# Patient Record
Sex: Female | Born: 1975
Health system: Southern US, Community
[De-identification: ages and names within clinical notes are randomized; demographics above are authoritative.]

## PROBLEM LIST (undated history)

## (undated) DIAGNOSIS — E7800 Pure hypercholesterolemia, unspecified: Secondary | ICD-10-CM

## (undated) DIAGNOSIS — E119 Type 2 diabetes mellitus without complications: Secondary | ICD-10-CM

## (undated) HISTORY — DX: Pure hypercholesterolemia, unspecified: E78.00

---

## 2015-07-19 HISTORY — PX: DILATION AND CURETTAGE OF UTERUS: SHX78

## 2020-01-23 NOTE — Progress Notes (Deleted)
    New patient visit   Patient: Cathy Ponce   DOB: [birth year]   44 y.o. Female  MRN: [id number] Visit Date: 01/24/2020  Today's healthcare provider: Trey Sailors, PA-C   No chief complaint on file.  Subjective    Cathy Ponce is a 44 y.o. female who presents today as a new patient to establish care.  HPI  ***  No past medical history on file. *** The histories are not reviewed yet. Please review them in the "History" navigator section and refresh this SmartLink. No family status information on file.   No family history on file. Social History   Socioeconomic History  . Marital status: Married    Spouse name: Not on file  . Number of children: Not on file  . Years of education: Not on file  . Highest education level: Not on file  Occupational History  . Not on file  Tobacco Use  . Smoking status: Not on file  Substance and Sexual Activity  . Alcohol use: Not on file  . Drug use: Not on file  . Sexual activity: Not on file  Other Topics Concern  . Not on file  Social History Narrative  . Not on file   Social Determinants of Health   Financial Resource Strain:   . Difficulty of Paying Living Expenses:   Food Insecurity:   . Worried About Programme researcher, broadcasting/film/video in the Last Year:   . Barista in the Last Year:   Transportation Needs:   . Freight forwarder (Medical):   Marland Kitchen Lack of Transportation (Non-Medical):   Physical Activity:   . Days of Exercise per Week:   . Minutes of Exercise per Session:   Stress:   . Feeling of Stress :   Social Connections:   . Frequency of Communication with Friends and Family:   . Frequency of Social Gatherings with Friends and Family:   . Attends Religious Services:   . Active Member of Clubs or Organizations:   . Attends Banker Meetings:   Marland Kitchen Marital Status:    No outpatient medications prior to visit.   No facility-administered medications prior to visit.   Not on  File   There is no immunization history on file for this patient.  Health Maintenance  Topic Date Due  . Hepatitis C Screening  Never done  . HIV Screening  Never done  . TETANUS/TDAP  Never done  . PAP SMEAR-Modifier  Never done  . INFLUENZA VACCINE  02/16/2020    No care team member to display  Review of Systems  Constitutional: Negative.   HENT: Negative.   Eyes: Negative.   Respiratory: Negative.   Cardiovascular: Negative.   Gastrointestinal: Negative.   Endocrine: Negative.   Genitourinary: Negative.   Musculoskeletal: Negative.   Skin: Negative.   Allergic/Immunologic: Negative.   Neurological: Negative.   Hematological: Negative.   Psychiatric/Behavioral: Negative.     {Heme  Chem  Endocrine  Serology  Results Review (optional):[id number]::" "}  Objective    There were no vitals taken for this visit. Physical Exam ***  Depression Screen No flowsheet data found. No results found for any visits on 01/24/20.  Assessment & Plan     ***  No follow-ups on file.     {provider attestation***:1}   Maryella Shivers  [facility] [phone] (phone) [phone] (fax)  [facility]

## 2020-01-24 ENCOUNTER — Ambulatory Visit: Payer: Self-pay | Admitting: Physician Assistant

## 2020-06-22 ENCOUNTER — Other Ambulatory Visit: Payer: Self-pay

## 2020-06-22 ENCOUNTER — Emergency Department: Payer: BC Managed Care – PPO

## 2020-06-22 ENCOUNTER — Emergency Department
Admission: EM | Admit: 2020-06-22 | Discharge: 2020-06-22 | Disposition: A | Discharge status: Home / Self Care | Payer: BC Managed Care – PPO | Attending: Emergency Medicine | Admitting: Emergency Medicine

## 2020-06-22 DIAGNOSIS — M7989 Other specified soft tissue disorders: Secondary | ICD-10-CM | POA: Diagnosis not present

## 2020-06-22 DIAGNOSIS — S6992XA Unspecified injury of left wrist, hand and finger(s), initial encounter: Secondary | ICD-10-CM | POA: Diagnosis not present

## 2020-06-22 DIAGNOSIS — X58XXXA Exposure to other specified factors, initial encounter: Secondary | ICD-10-CM | POA: Insufficient documentation

## 2020-06-22 DIAGNOSIS — R202 Paresthesia of skin: Secondary | ICD-10-CM | POA: Diagnosis not present

## 2020-06-22 DIAGNOSIS — E119 Type 2 diabetes mellitus without complications: Secondary | ICD-10-CM | POA: Diagnosis not present

## 2020-06-22 DIAGNOSIS — R519 Headache, unspecified: Secondary | ICD-10-CM | POA: Diagnosis not present

## 2020-06-22 DIAGNOSIS — Z7984 Long term (current) use of oral hypoglycemic drugs: Secondary | ICD-10-CM | POA: Insufficient documentation

## 2020-06-22 HISTORY — DX: Type 2 diabetes mellitus without complications: E11.9

## 2020-06-22 LAB — CBC
HCT: 37.9 % (ref 36.0–46.0)
Hemoglobin: 12.6 g/dL (ref 12.0–15.0)
MCH: 30.1 pg (ref 26.0–34.0)
MCHC: 33.2 g/dL (ref 30.0–36.0)
MCV: 90.7 fL (ref 80.0–100.0)
Platelets: 243 10*3/uL (ref 150–400)
RBC: 4.18 MIL/uL (ref 3.87–5.11)
RDW: 12 % (ref 11.5–15.5)
WBC: 6.7 10*3/uL (ref 4.0–10.5)
nRBC: 0 % (ref 0.0–0.2)

## 2020-06-22 LAB — COMPREHENSIVE METABOLIC PANEL
ALT: 11 U/L (ref 0–44)
AST: 19 U/L (ref 15–41)
Albumin: 4 g/dL (ref 3.5–5.0)
Alkaline Phosphatase: 48 U/L (ref 38–126)
Anion gap: 8 (ref 5–15)
BUN: 11 mg/dL (ref 6–20)
CO2: 25 mmol/L (ref 22–32)
Calcium: 9.3 mg/dL (ref 8.9–10.3)
Chloride: 102 mmol/L (ref 98–111)
Creatinine, Ser: 0.57 mg/dL (ref 0.44–1.00)
GFR, Estimated: >60 mL/min (ref >60)
Glucose, Bld: 143 mg/dL — ABNORMAL HIGH (ref 70–99)
Potassium: 4 mmol/L (ref 3.5–5.1)
Sodium: 135 mmol/L (ref 135–145)
Total Bilirubin: 0.8 mg/dL (ref 0.3–1.2)
Total Protein: 7.8 g/dL (ref 6.5–8.1)

## 2020-06-22 LAB — TROPONIN I (HIGH SENSITIVITY): Troponin I (High Sensitivity): 3 ng/L (ref <18)

## 2020-06-22 MED ORDER — KETOROLAC TROMETHAMINE 30 MG/ML IJ SOLN
30.0000 mg | Freq: Once | INTRAMUSCULAR | Status: AC
  Administered 2020-06-22: 30 mg via INTRAVENOUS
  Filled 2020-06-22: qty 1

## 2020-06-22 MED ORDER — IBUPROFEN 600 MG PO TABS: 600.0000 mg | ORAL_TABLET | Freq: Four times a day (QID) | ORAL | 0 refills | Status: DC | PRN

## 2020-06-22 MED ORDER — ONDANSETRON HCL 4 MG/2ML IJ SOLN
4.0000 mg | Freq: Once | INTRAMUSCULAR | Status: AC
  Administered 2020-06-22: 4 mg via INTRAVENOUS
  Filled 2020-06-22: qty 2

## 2020-06-22 NOTE — ED Provider Notes (Signed)
[facility] Emergency Department Provider Note  ____________________________________________  Time seen: Approximately 1:08 PM  I have reviewed the triage vital signs and the nursing notes.   HISTORY  Chief Complaint Headache    HPI Cathy Ponce is a 44 y.o. female with a past medical history of diabetes that presents to the emergency department for evaluation tingling to her chin, bilateral cheeks, and across forehead for 5 days and a headache yesterday.  Patient states that headache went from her forehead over the top of her head to the back of her head.  Headache has improved since yesterday. She has a very minimal headache currently.  She still has the tingling across her chin and both of her cheeks.  No trauma.  She has not had a headache in a long time.  No fevers.   Past Medical History:  Diagnosis Date  . Diabetes mellitus without complication (HCC)     There are no problems to display for this patient.   History reviewed. No pertinent surgical history.  Prior to Admission medications   Medication Sig Start Date End Date Taking? Authorizing Provider  metFORMIN (GLUCOPHAGE) 500 MG tablet Take 500 mg by mouth daily.   Yes [provider]  ibuprofen (ADVIL) 600 MG tablet Take 1 tablet (600 mg total) by mouth every 6 (six) hours as needed. 06/22/20   Enid Derry, PA-C    Allergies Penicillins  No family history on file.  Social History Social History   Tobacco Use  . Smoking status: Never Smoker  . Smokeless tobacco: Never Used  Substance Use Topics  . Alcohol use: Yes  . Drug use: Not Currently     Review of Systems  Cardiovascular: No chest pain. Respiratory: No SOB. Gastrointestinal: No abdominal pain.  No nausea, no vomiting.  Musculoskeletal: Negative for musculoskeletal pain. Skin: Negative for rash, abrasions, lacerations, ecchymosis. Neurological: Negative for numbness or tingling. Positive for  headache.   ____________________________________________   PHYSICAL EXAM:  VITAL SIGNS: ED Triage Vitals  Enc Vitals Group     BP 06/22/20 0950 (!) 126/57     Pulse Rate 06/22/20 0950 70     Resp 06/22/20 0950 17     Temp 06/22/20 0950 99.2 F (37.3 C)     Temp Source 06/22/20 0950 Oral     SpO2 06/22/20 0950 99 %     Weight 06/22/20 0951 140 lb (63.5 kg)     Height 06/22/20 0951 5\' 3"  (1.6 m)     Head Circumference --      Peak Flow --      Pain Score 06/22/20 0950 3     Pain Loc --      Pain Edu? --      Excl. in GC? --      Constitutional: Alert and oriented. Well appearing and in no acute distress. Eyes: Conjunctivae are normal. PERRL. EOMI. Head: Atraumatic. ENT:      Ears:      Nose: No congestion/rhinnorhea.      Mouth/Throat: Mucous membranes are moist.  Neck: No stridor.   Cardiovascular: Normal rate, regular rhythm.  Good peripheral circulation. Respiratory: Normal respiratory effort without tachypnea or retractions. Lungs CTAB. Good air entry to the bases with no decreased or absent breath sounds. Gastrointestinal: Bowel sounds 4 quadrants. Soft and nontender to palpation. No guarding or rigidity. No palpable masses. No distention. Musculoskeletal: Full range of motion to all extremities. No gross deformities appreciated. Neurologic: Normal speech and language.  No gross focal neurologic deficits are appreciated.  Cranial nerves: 2-10 normal as tested. Strength 5/5 in upper and lower extremities Cerebellar: Finger-nose-finger WNL, Heel to shin WNL Sensorimotor: No pronator drift, clonus, sensory loss or abnormal reflexes. No vision deficits noted to confrontation bilaterally.  Speech: No dysarthria or expressive aphasia Skin:  Skin is warm, dry and intact. No rash noted. Psychiatric: Mood and affect are normal. Speech and behavior are normal. Patient exhibits appropriate insight and judgement.   ____________________________________________   LABS (all  labs ordered are listed, but only abnormal results are displayed)  Labs Reviewed  COMPREHENSIVE METABOLIC PANEL - Abnormal; Notable for the following components:      Result Value   Glucose, Bld 143 (*)    All other components within normal limits  CBC  TROPONIN I (HIGH SENSITIVITY)   ____________________________________________  EKG   ____________________________________________  RADIOLOGY Lexine Baton, personally viewed and evaluated these images (plain radiographs) as part of my medical decision making, as well as reviewing the written report by the radiologist.  Head CT    IMPRESSION:  No acute intracranial findings.    Finger Xray    IMPRESSION:  Soft tissue swelling second PIP. Negative for fracture.    ____________________________________________    PROCEDURES  Procedure(s) performed:    Procedures    Medications  ketorolac (TORADOL) 30 MG/ML injection 30 mg (30 mg Intravenous Given 06/22/20 1454)  ondansetron (ZOFRAN) injection 4 mg (4 mg Intravenous Given 06/22/20 1454)     ____________________________________________   INITIAL IMPRESSION / ASSESSMENT AND PLAN / ED COURSE  Pertinent labs & imaging results that were available during my care of the patient were reviewed by me and considered in my medical decision making (see chart for details).  Review of the [place] CSRS was performed in accordance of the [facility] prior to dispensing any controlled drugs.   Patient presents to emergency department for evaluation of a headache yesterday and tingling down the center of face since Wednesday and headache yesterday.  Lab work and CT scan was ordered to evaluate her symptoms.  Neuro exam is normal without any objective findings.  Her numbness and tingling to her face is bilateral and not located to one side.  CT scan is negative for acute intercranial abnormality.  CMP, BMP, troponin largely unremarkable.  ----------------------------------------- 2:08  PM on 06/22/2020 -----------------------------------------  Case and imaging was discussed with Dr. Roxan Hockey, who is in agreement with outpatient follow-up with neurology or primary care.  Patient is in agreement with follow-up with neurology or primary care as an outpatient.  Prior to discharge, patient also asks if we can do an x-ray of her left index finger.  She got her index finger caught in a dog leash about 1 month ago and pain never completely went away.  She still has swelling to her PIP digit of the left finger.  She is moving it normally.  She has been wearing a splint.  X-ray is negative for acute bony abnormalities.  Patient will follow up with hand orthopedics for this.  Patient will be discharged home with prescriptions for Motrin. Patient is to follow up with primary care, neurology, hand orthopedics as directed. Patient is given ED precautions to return to the ED for any worsening or new symptoms.   Cathy Ponce was evaluated in Emergency Department on 06/23/2020 for the symptoms described in the history of present illness. She was evaluated in the context of the global COVID-19 pandemic, which necessitated consideration that  the patient might be at risk for infection with the SARS-CoV-2 virus that causes COVID-19. Institutional protocols and algorithms that pertain to the evaluation of patients at risk for COVID-19 are in a state of rapid change based on information released by regulatory bodies including the CDC and federal and state organizations. These policies and algorithms were followed during the patient's care in the ED.  ____________________________________________  FINAL CLINICAL IMPRESSION(S) / ED DIAGNOSES  Final diagnoses:  Acute nonintractable headache, unspecified headache type  Injury of finger of left hand, initial encounter      NEW MEDICATIONS STARTED DURING THIS VISIT:  ED Discharge Orders         Ordered    ibuprofen (ADVIL) 600 MG tablet   Every 6 hours PRN        06/22/20 1500              This chart was dictated using voice recognition software/Dragon. Despite best efforts to proofread, errors can occur which can change the meaning. Any change was purely unintentional.    Enid Derry, PA-C 06/23/20 1422    Willy Eddy, MD 06/23/20 1524

## 2020-06-22 NOTE — ED Notes (Signed)
See triage note.  Says she has headache and numbness over chin and lower lip bilateral.  Says she had some numbness in left leg on Thursday and they told her it was a panic attack.  (EMS came to her work)  Alert and oriented. nad.

## 2020-06-22 NOTE — ED Triage Notes (Signed)
Pt c/o intense headache since Wednesday with intermittent numbness across her forehead and nose and mouth. No noted facial droop the numbness is symmetrical. Pt is ambulatory with a steady gait. Pt does have a hx of migraines.

## 2020-07-02 DIAGNOSIS — E113393 Type 2 diabetes mellitus with moderate nonproliferative diabetic retinopathy without macular edema, bilateral: Secondary | ICD-10-CM | POA: Diagnosis not present

## 2020-07-08 ENCOUNTER — Other Ambulatory Visit: Payer: Self-pay

## 2020-07-08 ENCOUNTER — Ambulatory Visit (INDEPENDENT_AMBULATORY_CARE_PROVIDER_SITE_OTHER): Payer: BC Managed Care – PPO | Admitting: Internal Medicine

## 2020-07-08 ENCOUNTER — Encounter: Payer: Self-pay | Admitting: Internal Medicine

## 2020-07-08 VITALS — BP 113/62 | HR 66 | Ht 63.0 in | Wt 150.2 lb

## 2020-07-08 DIAGNOSIS — G44009 Cluster headache syndrome, unspecified, not intractable: Secondary | ICD-10-CM | POA: Insufficient documentation

## 2020-07-08 DIAGNOSIS — E139 Other specified diabetes mellitus without complications: Secondary | ICD-10-CM | POA: Diagnosis not present

## 2020-07-08 DIAGNOSIS — J0100 Acute maxillary sinusitis, unspecified: Secondary | ICD-10-CM | POA: Diagnosis not present

## 2020-07-08 MED ORDER — AZITHROMYCIN 250 MG PO TABS: ORAL_TABLET | ORAL | 0 refills | Status: AC

## 2020-07-08 NOTE — Progress Notes (Signed)
New Patient Office Visit  Subjective:  Patient ID: Cathy Ponce, female    DOB: 10/14/1975  Age: 44 y.o. MRN: [id number]  CC:  Chief Complaint  Patient presents with  . New Patient (Initial Visit)    Sinusitis This is a recurrent problem. The current episode started 1 to 4 weeks ago. The problem has been gradually worsening since onset. There has been no fever. Her pain is at a severity of 7/10. Associated symptoms include headaches. Pertinent negatives include no chills, congestion, coughing, diaphoresis, ear pain, neck pain, shortness of breath, sneezing, sore throat or swollen glands. Past treatments include acetaminophen. The treatment provided no relief.   Patient presents for headache  Past Medical History:  Diagnosis Date  . Diabetes mellitus without complication (HCC)      Current Outpatient Medications:  .  acetaminophen (TYLENOL) 500 MG tablet, Take 500 mg by mouth 2 (two) times daily., Disp: , Rfl:  .  azithromycin (ZITHROMAX) 250 MG tablet, 2 tab po daily for 3 days, Disp: 6 tablet, Rfl: 0 .  metFORMIN (GLUCOPHAGE) 500 MG tablet, Take 500 mg by mouth 2 (two) times daily with a meal., Disp: , Rfl:    History reviewed. No pertinent surgical history.  History reviewed. No pertinent family history.  Social History   Socioeconomic History  . Marital status: Married    Spouse name: Not on file  . Number of children: Not on file  . Years of education: Not on file  . Highest education level: Not on file  Occupational History  . Not on file  Tobacco Use  . Smoking status: Never Smoker  . Smokeless tobacco: Never Used  Substance and Sexual Activity  . Alcohol use: Yes  . Drug use: Not Currently  . Sexual activity: Not on file  Other Topics Concern  . Not on file  Social History Narrative  . Not on file   Social Determinants of Health   Financial Resource Strain: Not on file  Food Insecurity: Not on file  Transportation Needs: Not on file   Physical Activity: Not on file  Stress: Not on file  Social Connections: Not on file  Intimate Partner Violence: Not on file    ROS Review of Systems  Constitutional: Negative.  Negative for chills and diaphoresis.  HENT: Negative.  Negative for congestion, ear pain, sneezing and sore throat.   Eyes: Negative.   Respiratory: Negative.  Negative for cough and shortness of breath.   Cardiovascular: Negative.   Gastrointestinal: Negative.   Endocrine: Negative.   Genitourinary: Negative.   Musculoskeletal: Negative.  Negative for neck pain.  Skin: Negative.   Allergic/Immunologic: Negative.   Neurological: Positive for headaches.  Hematological: Negative.   Psychiatric/Behavioral: Negative.   All other systems reviewed and are negative.   Objective:   Today's Vitals: BP 113/62   Pulse 66   Ht 5\' 3"  (1.6 m)   Wt 150 lb 3.2 oz (68.1 kg)   LMP 06/11/2020 (Exact Date)   BMI 26.61 kg/m   Physical Exam Constitutional:      Appearance: Normal appearance. She is obese.  HENT:     Head: Normocephalic.     Nose: Nose normal. No congestion.     Mouth/Throat:     Mouth: Mucous membranes are moist.  Eyes:     Conjunctiva/sclera: Conjunctivae normal.     Pupils: Pupils are equal, round, and reactive to light.  Cardiovascular:     Rate and Rhythm: Normal rate.  Pulses: Normal pulses.  Pulmonary:     Effort: Pulmonary effort is normal.  Abdominal:     General: Abdomen is flat.  Musculoskeletal:        General: Normal range of motion.     Cervical back: No rigidity.  Skin:    General: Skin is warm.     Coloration: Skin is not jaundiced.  Neurological:     General: No focal deficit present.     Mental Status: She is alert.     Comments: headache  Psychiatric:        Mood and Affect: Mood normal.     Assessment & Plan:   Problem List Items Addressed This Visit      Respiratory   Subacute maxillary sinusitis - Primary    Patient has allergic rhinitis her CT  scan did not show any evidence of sinusitis.  She also does not like bright light.  She has seen the eye doctor and eyesight is okay.      Relevant Medications   azithromycin (ZITHROMAX) 250 MG tablet     Endocrine   Diabetes 1.5, managed as type 2 (HCC)    - The patient's blood sugar is under control on metformin. - The patient will continue the current treatment regimen.  - I encouraged the patient to regularly check blood sugar.  - I encouraged the patient to monitor diet. I encouraged the patient to eat low-carb and low-sugar to help prevent blood sugar spikes.  - I encouraged the patient to continue following their prescribed treatment plan for diabetes - I informed the patient to get help if blood sugar drops below 54mg /dL, or if suddenly have trouble thinking clearly or breathing.            Nervous and Auditory   Cluster headache, not intractable    Patient CT scan was reviewed and it was found to be abnormal.  I told the patient to take Claritin 5 mg p.o. daily.  Give her a course of Z-Pak.  She was advised to reduce Metformin to 500 mg 1 tablet daily.  Advised to use sun glasses while working because she does not like the bright light.      Relevant Medications   acetaminophen (TYLENOL) 500 MG tablet      Outpatient Encounter Medications as of 07/08/2020  Medication Sig  . acetaminophen (TYLENOL) 500 MG tablet Take 500 mg by mouth 2 (two) times daily.  07/10/2020 azithromycin (ZITHROMAX) 250 MG tablet 2 tab po daily for 3 days  . metFORMIN (GLUCOPHAGE) 500 MG tablet Take 500 mg by mouth 2 (two) times daily with a meal.  . [DISCONTINUED] ibuprofen (ADVIL) 600 MG tablet Take 1 tablet (600 mg total) by mouth every 6 (six) hours as needed.   No facility-administered encounter medications on file as of 07/08/2020.    Follow-up: No follow-ups on file.   07/10/2020, MD

## 2020-07-08 NOTE — Assessment & Plan Note (Signed)
Patient has allergic rhinitis her CT scan did not show any evidence of sinusitis.  She also does not like bright light.  She has seen the eye doctor and eyesight is okay.

## 2020-07-08 NOTE — Assessment & Plan Note (Signed)
Patient CT scan was reviewed and it was found to be abnormal.  I told the patient to take Claritin 5 mg p.o. daily.  Give her a course of Z-Pak.  She was advised to reduce Metformin to 500 mg 1 tablet daily.  Advised to use sun glasses while working because she does not like the bright light.

## 2020-07-08 NOTE — Assessment & Plan Note (Signed)
-   The patient's blood sugar is under control on metformin. - The patient will continue the current treatment regimen.  - I encouraged the patient to regularly check blood sugar.  - I encouraged the patient to monitor diet. I encouraged the patient to eat low-carb and low-sugar to help prevent blood sugar spikes.  - I encouraged the patient to continue following their prescribed treatment plan for diabetes - I informed the patient to get help if blood sugar drops below 54mg/dL, or if suddenly have trouble thinking clearly or breathing.     

## 2020-08-05 ENCOUNTER — Ambulatory Visit: Payer: BC Managed Care – PPO | Admitting: Internal Medicine

## 2020-10-15 DIAGNOSIS — F4323 Adjustment disorder with mixed anxiety and depressed mood: Secondary | ICD-10-CM | POA: Diagnosis not present

## 2020-10-15 DIAGNOSIS — Z130 Encounter for screening for diseases of the blood and blood-forming organs and certain disorders involving the immune mechanism: Secondary | ICD-10-CM | POA: Diagnosis not present

## 2020-10-15 DIAGNOSIS — E119 Type 2 diabetes mellitus without complications: Secondary | ICD-10-CM | POA: Diagnosis not present

## 2020-10-15 DIAGNOSIS — J309 Allergic rhinitis, unspecified: Secondary | ICD-10-CM | POA: Diagnosis not present

## 2021-04-19 ENCOUNTER — Other Ambulatory Visit (HOSPITAL_COMMUNITY): Payer: Self-pay | Admitting: Physician Assistant

## 2021-04-19 ENCOUNTER — Other Ambulatory Visit: Payer: Self-pay | Admitting: Physician Assistant

## 2021-04-19 DIAGNOSIS — R202 Paresthesia of skin: Secondary | ICD-10-CM

## 2021-04-19 DIAGNOSIS — R519 Headache, unspecified: Secondary | ICD-10-CM

## 2021-04-19 DIAGNOSIS — R200 Anesthesia of skin: Secondary | ICD-10-CM

## 2021-06-08 ENCOUNTER — Ambulatory Visit
Admission: RE | Admit: 2021-06-08 | Discharge: 2021-06-08 | Disposition: A | Discharge status: Home / Self Care | Payer: BC Managed Care – PPO | Source: Ambulatory Visit | Attending: Physician Assistant | Admitting: Physician Assistant

## 2021-06-08 DIAGNOSIS — R200 Anesthesia of skin: Secondary | ICD-10-CM | POA: Insufficient documentation

## 2021-06-08 DIAGNOSIS — R202 Paresthesia of skin: Secondary | ICD-10-CM | POA: Diagnosis present

## 2021-06-08 DIAGNOSIS — R519 Headache, unspecified: Secondary | ICD-10-CM | POA: Insufficient documentation

## 2021-06-08 MED ORDER — GADOBUTROL 1 MMOL/ML IV SOLN
6.0000 mL | Freq: Once | INTRAVENOUS | Status: AC | PRN
  Administered 2021-06-08: 6 mL via INTRAVENOUS

## 2021-08-31 ENCOUNTER — Ambulatory Visit (LOCAL_COMMUNITY_HEALTH_CENTER): Payer: Self-pay

## 2021-08-31 ENCOUNTER — Other Ambulatory Visit: Payer: Self-pay

## 2021-08-31 ENCOUNTER — Ambulatory Visit: Payer: Self-pay

## 2021-08-31 DIAGNOSIS — Z111 Encounter for screening for respiratory tuberculosis: Secondary | ICD-10-CM

## 2021-08-31 NOTE — Progress Notes (Signed)
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 Presents vaccine record from [place] and requests placement of vaccines into [facility]. RN will plan to give pt [facility] copy at [facility] appt (09/03/21). Jerel Shepherd, RN

## 2021-09-03 ENCOUNTER — Ambulatory Visit (LOCAL_COMMUNITY_HEALTH_CENTER): Payer: Self-pay

## 2021-09-03 ENCOUNTER — Other Ambulatory Visit: Payer: Self-pay

## 2021-09-03 DIAGNOSIS — Z111 Encounter for screening for respiratory tuberculosis: Secondary | ICD-10-CM

## 2021-09-03 LAB — TB SKIN TEST
Induration: 0 mm
TB Skin Test: NEGATIVE

## 2021-09-03 NOTE — Progress Notes (Signed)
Copy of [facility] record given to client. Jossie Ng, RN

## 2022-01-30 IMAGING — DX DG FINGER INDEX 2+V*L*
3 series · 3 of 3 positions shown · non-contrast
Comparison: None.

CLINICAL DATA: Finger injury 1 month ago

EXAM:
LEFT INDEX FINGER 2+V

[finger ap]
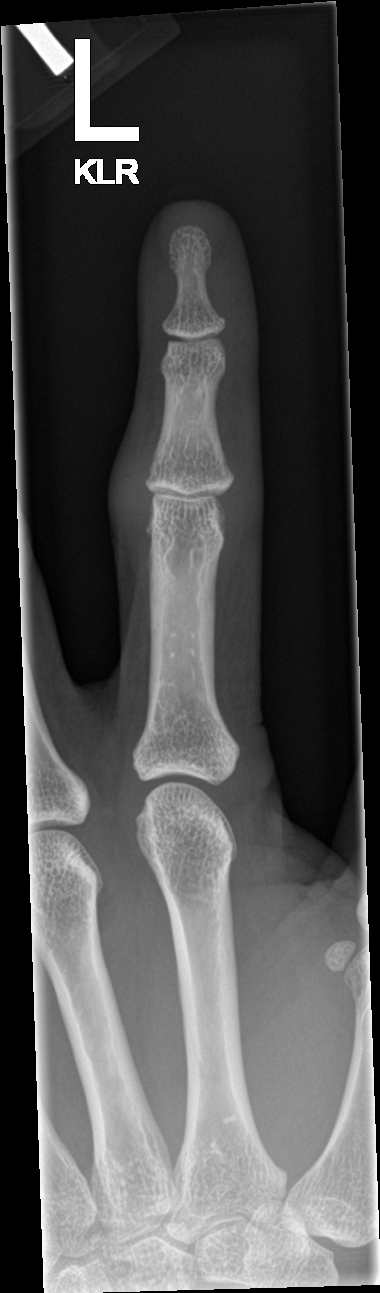

[finger obl]
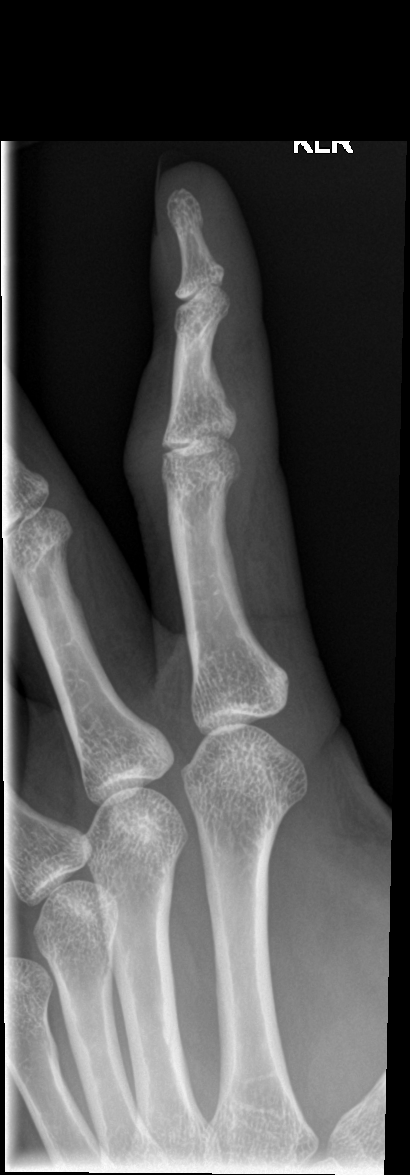

[finger lat]
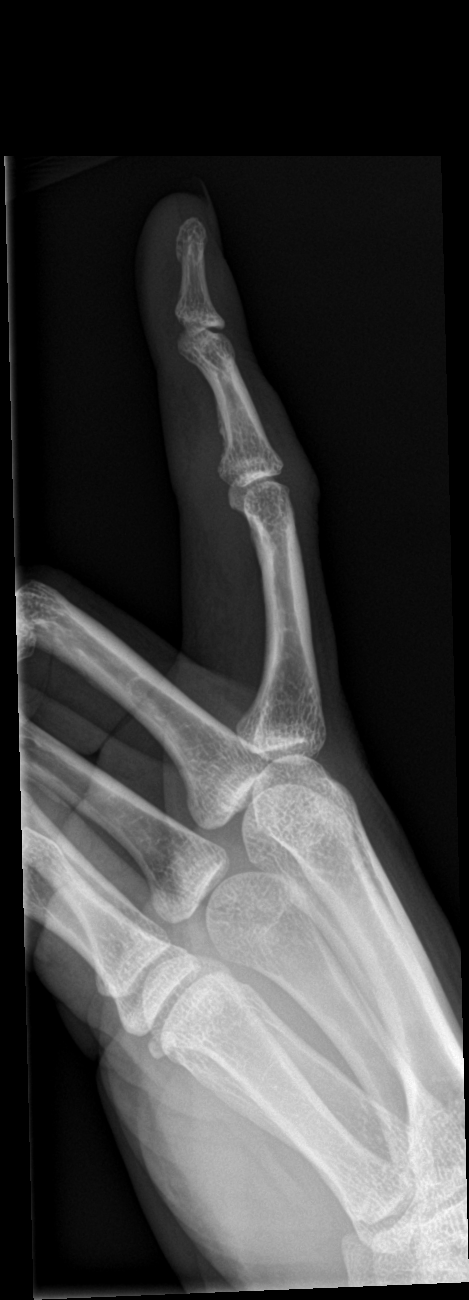

[3 of 3 positions shown; findings below may reference images not displayed]

FINDINGS: Soft tissue swelling radial side of the second PIP joint. No
fracture or arthropathy.
IMPRESSION: Soft tissue swelling second PIP.  Negative for fracture.

## 2022-01-30 IMAGING — CT CT HEAD W/O CM
3 series · 16 of 46 positions shown, 19 images · non-contrast
Comparison: None.

CLINICAL DATA: Headache

EXAM:
CT HEAD WITHOUT CONTRAST
TECHNIQUE: Contiguous axial images were obtained from the base of the skull
through the vertex without intravenous contrast.

[Series 3: head wo · axial · 0.37mm/px · z∈[+557,+677]mm · 10 of 29 slices shown, 13 images]
[im 3/29  brain]
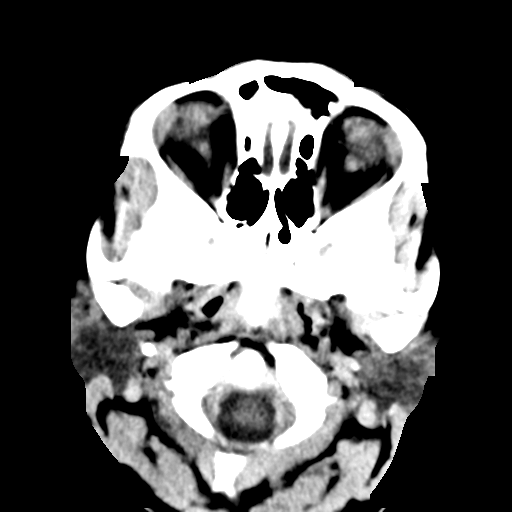
[im 3/29  bone]
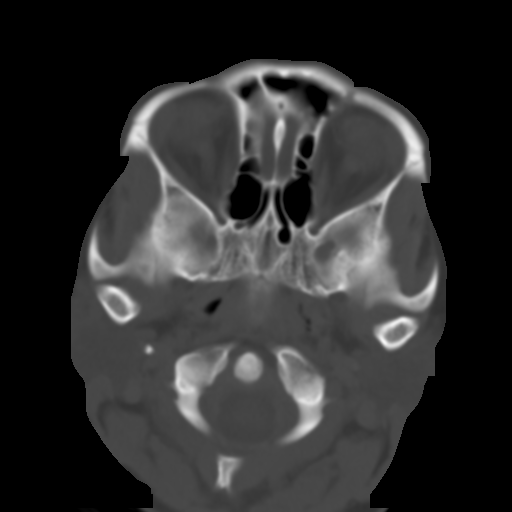
[im 6/29  brain]
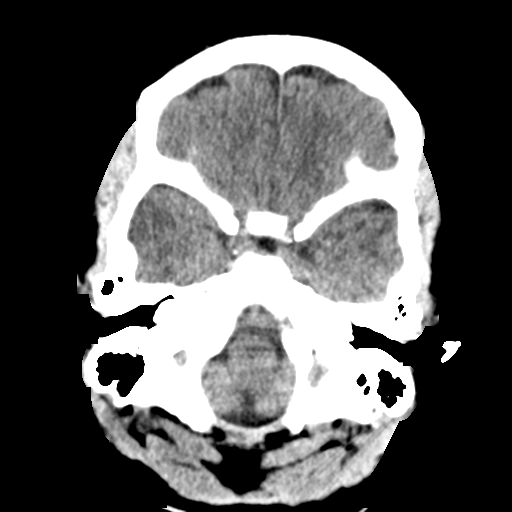
[im 8/29  brain]
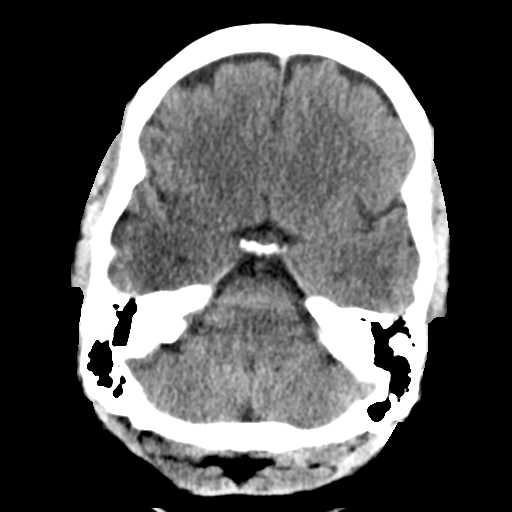
[im 11/29  brain]
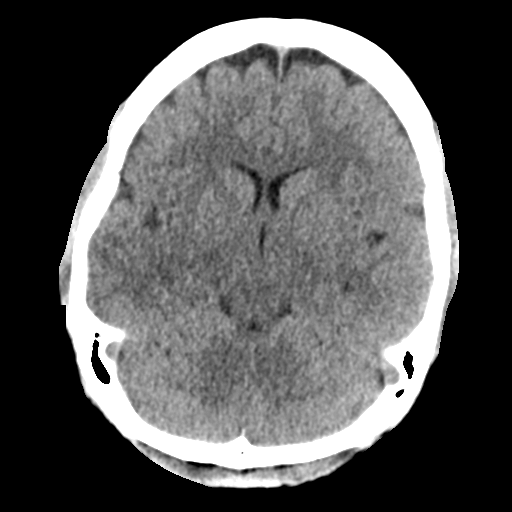
[im 14/29  brain]
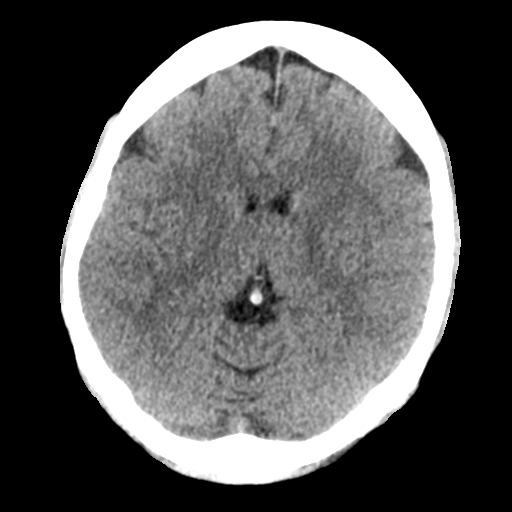
[im 14/29  bone]
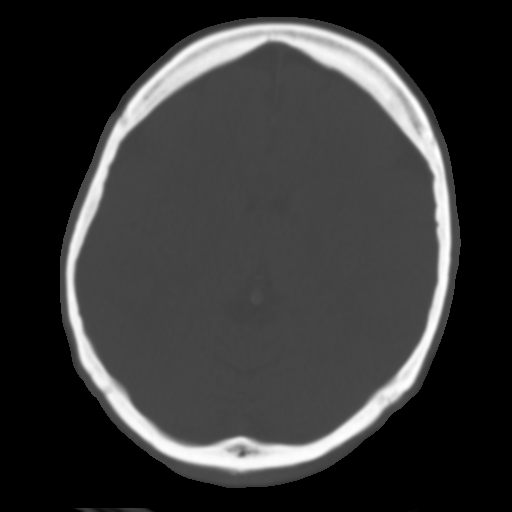
[im 16/29  brain]
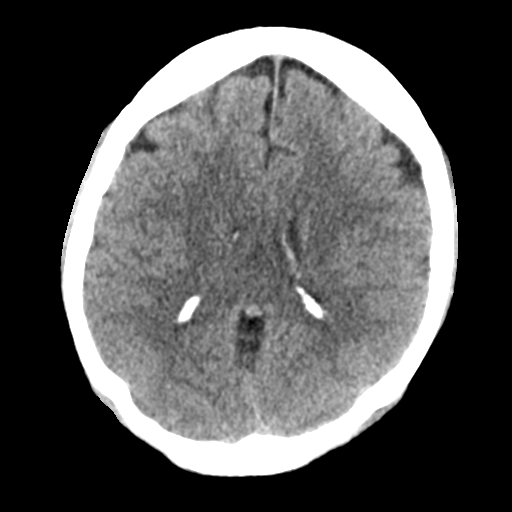
[im 19/29  brain]
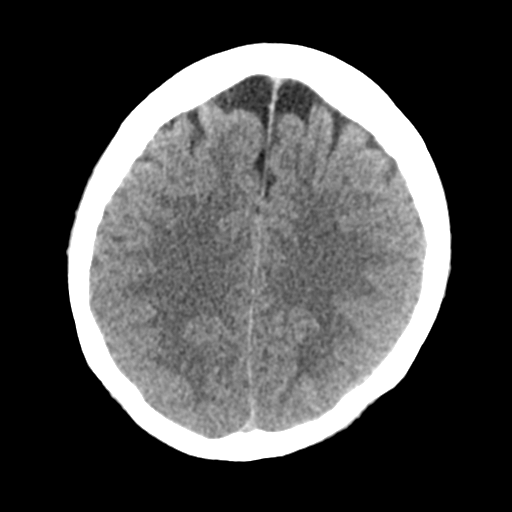
[im 22/29  brain]
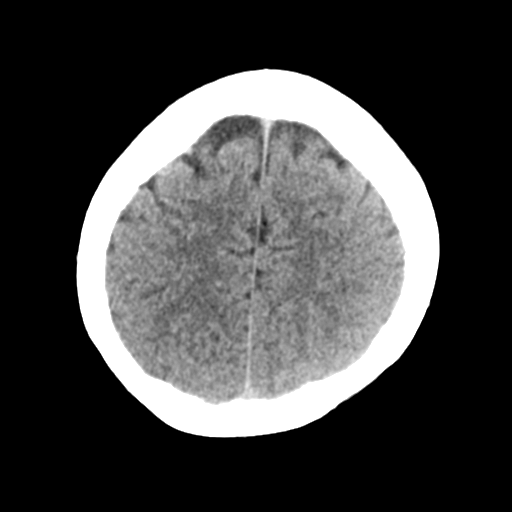
[im 24/29  brain]
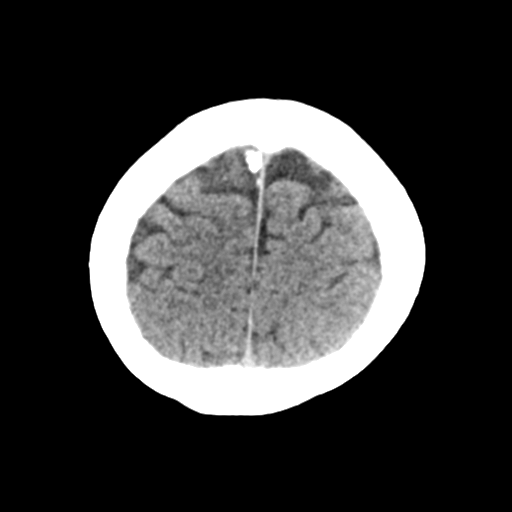
[im 24/29  bone]
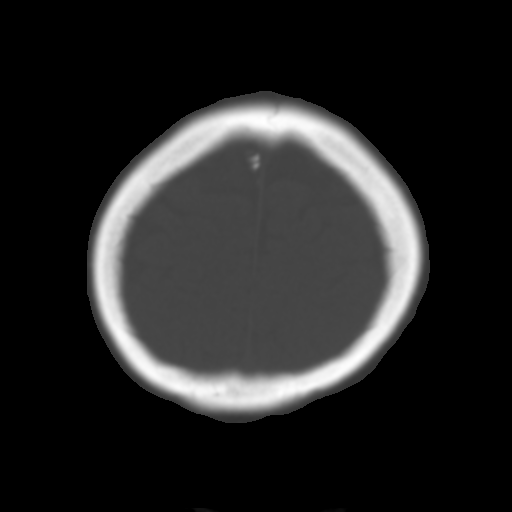
[im 27/29  brain]
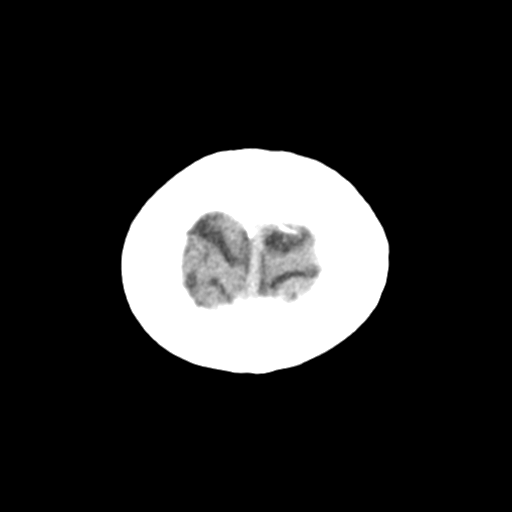

[Series 4: coronal soft tissue · coronal · 0.31mm/px · 3 of 60 slices shown]
[im 20/60  brain]
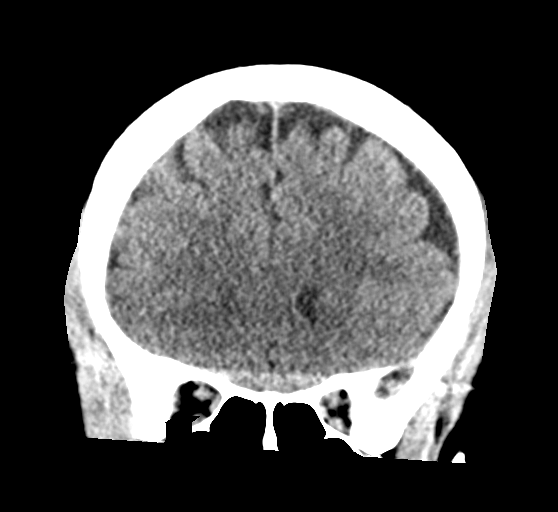
[im 27/60  brain]
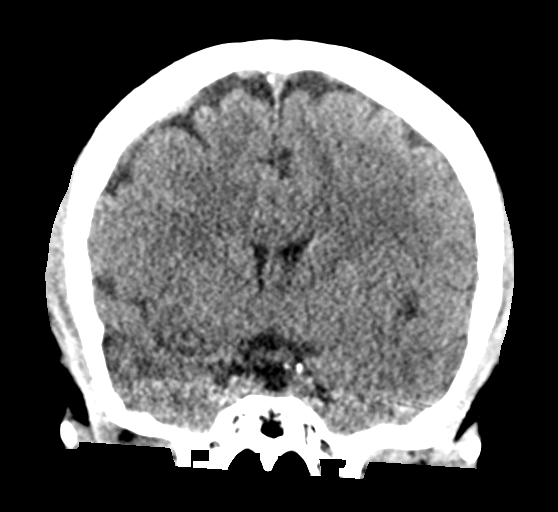
[im 33/60  brain]
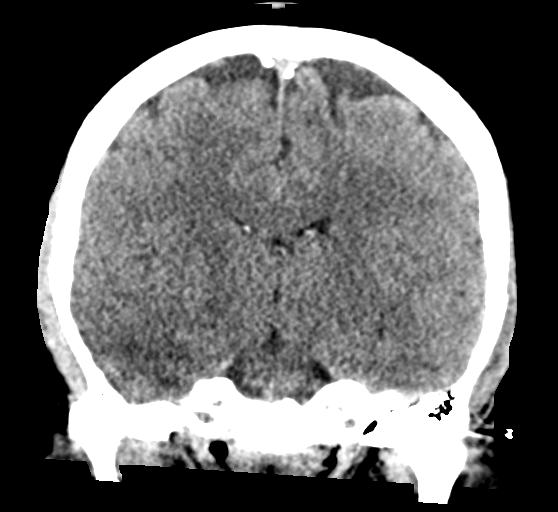

[Series 5: sagittal soft tissue · sagittal · 0.31mm/px · 3 of 58 slices shown]
[im 20/58  brain]
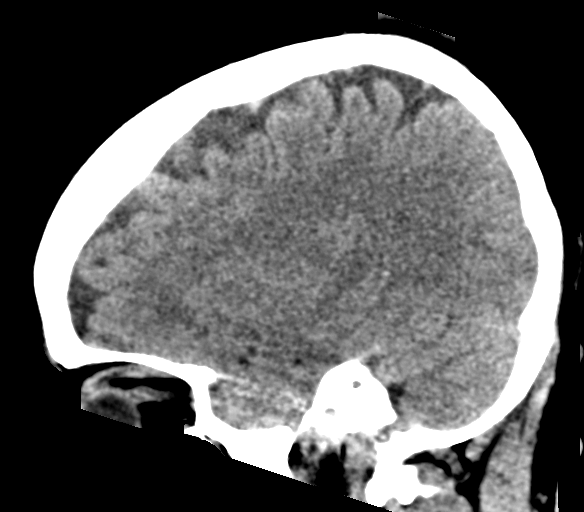
[im 29/58  brain]
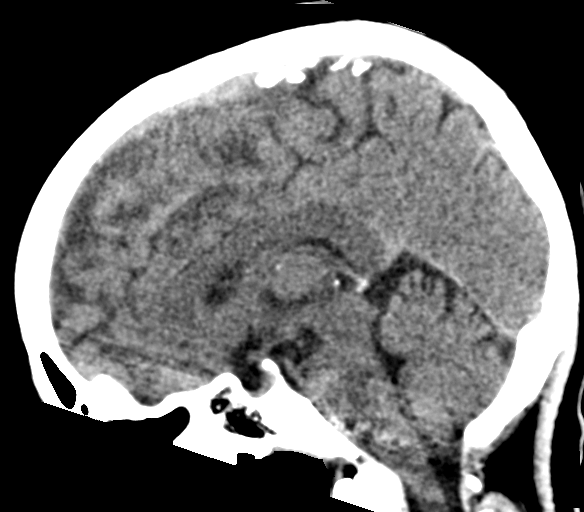
[im 39/58  brain]
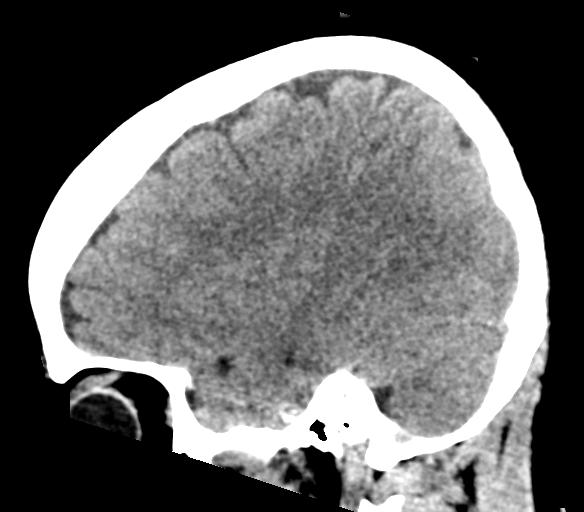

[16 of 46 positions shown; findings below may reference images not displayed]

FINDINGS: Brain: No evidence of acute infarction, hemorrhage, hydrocephalus,
extra-axial collection or mass lesion/mass effect.

Vascular: No hyperdense vessel or unexpected calcification.

Skull: Normal. Negative for fracture or focal lesion.

Sinuses/Orbits: No acute finding.

Other: None.
IMPRESSION: No acute intracranial findings.

## 2022-05-19 ENCOUNTER — Other Ambulatory Visit: Payer: Self-pay | Admitting: Family Medicine

## 2022-05-19 DIAGNOSIS — Z1231 Encounter for screening mammogram for malignant neoplasm of breast: Secondary | ICD-10-CM

## 2022-07-25 ENCOUNTER — Ambulatory Visit (LOCAL_COMMUNITY_HEALTH_CENTER): Payer: BC Managed Care – PPO | Admitting: Nurse Practitioner

## 2022-07-25 ENCOUNTER — Ambulatory Visit: Payer: Self-pay | Admitting: Nurse Practitioner

## 2022-07-25 VITALS — BP 119/59 | Ht 63.0 in | Wt 152.0 lb

## 2022-07-25 DIAGNOSIS — Z308 Encounter for other contraceptive management: Secondary | ICD-10-CM | POA: Diagnosis not present

## 2022-07-25 DIAGNOSIS — Z113 Encounter for screening for infections with a predominantly sexual mode of transmission: Secondary | ICD-10-CM

## 2022-07-25 DIAGNOSIS — Z01419 Encounter for gynecological examination (general) (routine) without abnormal findings: Secondary | ICD-10-CM

## 2022-07-25 DIAGNOSIS — Z3202 Encounter for pregnancy test, result negative: Secondary | ICD-10-CM | POA: Diagnosis not present

## 2022-07-25 DIAGNOSIS — Z3009 Encounter for other general counseling and advice on contraception: Secondary | ICD-10-CM

## 2022-07-25 LAB — HM HIV SCREENING LAB: HM HIV Screening: NEGATIVE

## 2022-07-25 LAB — PREGNANCY, URINE: Preg Test, Ur: NEGATIVE

## 2022-07-25 LAB — WET PREP FOR TRICH, YEAST, CLUE
Trichomonas Exam: NEGATIVE
Yeast Exam: NEGATIVE

## 2022-07-25 NOTE — Progress Notes (Signed)
See other encounter. Gregary Cromer, FNP

## 2022-07-25 NOTE — Progress Notes (Unsigned)
[facility] DEPARTMENT [facility] [address] Main Number: [phone]    Family Planning Visit- Initial Visit  Subjective:  Cathy Ponce Cathy Ponce is a 47 y.o.  [phone]   being seen today for an initial annual visit and to discuss reproductive life planning.  The patient is currently using No Method - Other Reason for pregnancy prevention. Patient reports   does not want a pregnancy in the next year.     report they are looking for a method that provides High efficacy at preventing pregnancy  Patient has the following medical conditions has Subacute maxillary sinusitis; Cluster headache, not intractable; and Diabetes 1.5, managed as type 2 (HCC) on their problem list.  Chief Complaint  Patient presents with   Contraception    Nexplanon insertion and std screening     Patient reports to clinic today for a physical, STD screening, and to discuss birth control.  Patient may be interested in the Nexplanon.    Body mass index is 26.93 kg/m. - Patient is eligible for diabetes screening based on BMI> 25 and age >35?  yes HA1C ordered? not applicable, patient already has diabetes.  Patient reports 1  partner/s in last year. Desires STI screening?  Yes  Has patient been screened once for HCV in the past?  No    Does the patient have current drug use (including MJ), have a partner with drug use, and/or has been incarcerated since last result? No  If yes-- Screen for HCV through [facility]   Does the patient meet criteria for HBV testing? No  Criteria:  -Household, sexual or needle sharing contact with HBV -History of drug use -HIV positive -Those with known Hep C   Health Maintenance Due  Topic Date Due   HEMOGLOBIN A1C  Never done   FOOT EXAM  Never done   OPHTHALMOLOGY EXAM  Never done   HIV Screening  Never done   Diabetic kidney evaluation - Urine ACR  Never done   Hepatitis C Screening  Never done   DTaP/Tdap/Td (1 - Tdap)  Never done   PAP SMEAR-Modifier  Never done   Diabetic kidney evaluation - eGFR measurement  06/22/2021   INFLUENZA VACCINE  02/15/2022   COVID-19 Vaccine (3 - 2023-24 season) 03/18/2022   COLONOSCOPY (Pts 45-45yrs Insurance coverage will need to be confirmed)  Never done    Review of Systems  Constitutional:  Negative for chills, fever, malaise/fatigue and weight loss.  HENT:  Negative for congestion, hearing loss and sore throat.   Eyes:  Negative for blurred vision, double vision and photophobia.  Respiratory:  Negative for shortness of breath.   Cardiovascular:  Negative for chest pain.  Gastrointestinal:  Negative for abdominal pain, blood in stool, constipation, diarrhea, heartburn, nausea and vomiting.  Genitourinary:  Negative for dysuria and frequency.  Musculoskeletal:  Negative for back pain, joint pain and neck pain.  Skin:  Negative for itching and rash.  Neurological:  Positive for headaches. Negative for dizziness and weakness.  Endo/Heme/Allergies:  Does not bruise/bleed easily.  Psychiatric/Behavioral:  Negative for depression, substance abuse and suicidal ideas.     The following portions of the patient's history were reviewed and updated as appropriate: allergies, current medications, past family history, past medical history, past social history, past surgical history and problem list. Problem list updated.   See flowsheet for other program required questions.  Objective:   Vitals:   07/25/22 1320  BP: (!) 119/59  Weight: 152 lb (68.9 kg)  Height: 5\' 3"  (1.6 m)    Physical Exam Constitutional:      Appearance: Normal appearance.  HENT:     Head: Normocephalic. No abrasion, masses or laceration. Hair is normal.     Jaw: No tenderness or swelling.     Right Ear: External ear normal.     Left Ear: External ear normal.     Nose: Nose normal.     Mouth/Throat:     Lips: Pink. No lesions.     Mouth: Mucous membranes are moist. No lacerations or oral  lesions.     Dentition: No dental caries.     Tongue: No lesions.     Palate: No mass and lesions.     Pharynx: No pharyngeal swelling, oropharyngeal exudate, posterior oropharyngeal erythema or uvula swelling.     Tonsils: No tonsillar exudate or tonsillar abscesses.  Eyes:     Pupils: Pupils are equal, round, and reactive to light.  Neck:     Thyroid: No thyroid mass, thyromegaly or thyroid tenderness.  Cardiovascular:     Rate and Rhythm: Normal rate and regular rhythm.  Pulmonary:     Effort: Pulmonary effort is normal.     Breath sounds: Normal breath sounds.  Chest:  Breasts:    Right: Normal. No swelling, mass, nipple discharge, skin change or tenderness.     Left: Normal. No swelling, mass, nipple discharge, skin change or tenderness.  Abdominal:     General: Abdomen is flat. Bowel sounds are normal.     Palpations: Abdomen is soft.     Tenderness: There is no abdominal tenderness. There is no rebound.  Genitourinary:    Pubic Area: No rash or pubic lice.      Labia:        Right: No rash, tenderness or lesion.        Left: No rash, tenderness or lesion.      Vagina: Normal. No vaginal discharge, erythema, tenderness or lesions.     Cervix: No cervical motion tenderness, discharge, lesion or erythema.     Uterus: Normal.      Adnexa:        Right: No tenderness.         Left: No tenderness.       Rectum: Normal.     Comments: Amount Discharge: small  Odor: No pH: less than 4.5 Adheres to vaginal wall: No Color: Cathy Ponce  Musculoskeletal:     Cervical back: Full passive range of motion without pain and normal range of motion.  Lymphadenopathy:     Cervical: No cervical adenopathy.     Right cervical: No superficial, deep or posterior cervical adenopathy.    Left cervical: No superficial, deep or posterior cervical adenopathy.     Upper Body:     Right upper body: No supraclavicular, axillary or epitrochlear adenopathy.     Left upper body: No supraclavicular,  axillary or epitrochlear adenopathy.     Lower Body: No right inguinal adenopathy. No left inguinal adenopathy.  Skin:    General: Skin is warm and dry.     Findings: No erythema, laceration, lesion or rash.  Neurological:     Mental Status: She is alert and oriented to person, place, and time.  Psychiatric:        Attention and Perception: Attention normal.        Mood and Affect: Mood normal.        Speech: Speech normal.  Behavior: Behavior normal. Behavior is cooperative.       Assessment and Plan:  Cathy Ponce is a 47 y.o. female presenting to the [facility] Department for an initial annual wellness/contraceptive visit  Contraception counseling: Reviewed options based on patient desire and reproductive life plan. Patient is interested in No Method - Other Reason.   Risks, benefits, and typical effectiveness rates were reviewed.  Questions were answered.  Written information was also given to the patient to review.    The patient will follow up in  1 years for surveillance.  The patient was told to call with any further questions, or with any concerns about this method of contraception.  Emphasized use of condoms 100% of the time for STI prevention.  Need for ECP was assessed. ECP not offered due to last sexual encounter.   1. Family planning counseling -47 year old female in clinic today in clinic today for a physical, STD screening, and would like to discuss birth control options. -ROS reviewed, patient with complaints of migraines.  Patient reports a history of migraines that were stressed induced and reports since she left her previous place of employment, she has a migraines once every couple of months.  -Patient desires Nexplanon as a birth control method.  Patient reports last sex was 07/17/22, which was unprotected, and reports last LMP around 06/27/22.  Patient cannot recall previous unprotected sex since last period.  PT negative today,  shared discussion to wait for 2 weeks or until menstrual period before having Nexplanon placed due to risk of pregnancy.  Patient advised to abstain from sex or to use condoms. -Patient agrees to STD screening today.   - Pregnancy, urine  2. Screening examination for venereal disease -STD screening today. -Patient accepted all screenings including oral GC, vaginal CT/GC, wet prep, and bloodwork for HIV/RPR.  Patient meets criteria for HepB screening? No. Ordered? No - low risk  Patient meets criteria for HepC screening? No. Ordered? No - low risk   Treat wet prep per standing order Discussed time line for [facility] Lab results and that patient will be called with positive results and encouraged patient to call if she had not heard in 2 weeks.  Counseled to return or seek care for continued or worsening symptoms Recommended condom use with all sex  Patient is currently not using  contraception  to prevent pregnancy.    - HIV [facility] - Syphilis Serology, [place] [facility] - Chlamydia/Gonorrhea [place] - WET PREP FOR TRICH, YEAST, CLUE - Gonococcus culture  3. Well woman exam with routine gynecological exam -Normal well woman exam. -CBE today, next due 07/2025 -Last PAP done 05/20/22, next PAP due 05/2023  Total time spent: 30 minutes    Return in about 1 year (around 07/26/2023) for Annual well-woman exam.    Glenna Fellows, FNP

## 2022-07-30 LAB — GONOCOCCUS CULTURE

## 2022-08-30 ENCOUNTER — Ambulatory Visit: Payer: BC Managed Care – PPO

## 2022-08-31 ENCOUNTER — Ambulatory Visit (LOCAL_COMMUNITY_HEALTH_CENTER): Payer: BC Managed Care – PPO | Admitting: Advanced Practice Midwife

## 2022-08-31 ENCOUNTER — Encounter: Payer: Self-pay | Admitting: Advanced Practice Midwife

## 2022-08-31 ENCOUNTER — Ambulatory Visit: Payer: BC Managed Care – PPO

## 2022-08-31 VITALS — BP 95/62 | HR 72 | Temp 97.4°F | Ht 63.0 in | Wt 144.4 lb

## 2022-08-31 DIAGNOSIS — Z308 Encounter for other contraceptive management: Secondary | ICD-10-CM | POA: Diagnosis not present

## 2022-08-31 DIAGNOSIS — Z3009 Encounter for other general counseling and advice on contraception: Secondary | ICD-10-CM

## 2022-08-31 DIAGNOSIS — Z30017 Encounter for initial prescription of implantable subdermal contraceptive: Secondary | ICD-10-CM | POA: Diagnosis not present

## 2022-08-31 MED ORDER — ETONOGESTREL 68 MG ~~LOC~~ IMPL
68.0000 mg | DRUG_IMPLANT | Freq: Once | SUBCUTANEOUS | Status: AC
  Administered 2022-08-31: 68 mg via SUBCUTANEOUS

## 2022-08-31 NOTE — Progress Notes (Signed)
[facility] Number: [phone]  Contraception/Family Planning VISIT ENCOUNTER NOTE  Subjective:   Cathy Ponce is a 47 y.o. MBF nonsmoker [id number] ([birth year]) female here for reproductive life counseling. The patient is currently using No Method - Other Reason to prevent pregnancy.    The patient does not want a pregnancy in the next year.  Pt desires Nexplanon insertion. Last PE 07/25/22. Last pap 05/20/22 ASCUS HPV neg at [facility]. Last sex 07/27/22 without condom; with current partner x 10 years; 1 partner in last 3 mo. Not working. 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 Living with husband and 2 kids. Denies cigs, vaping, cigars, MJ. Last ETOH 07/11/22 (1/2 glass Ba[place]ys).  LMP 08/28/22  Client states they are looking for the following:  High efficacy at preventing pregnancy  Denies abnormal vaginal bleeding, discharge, pelvic pain, problems with intercourse or other gynecologic concerns.    Gynecologic History Patient's last menstrual period was 08/28/2022 (exact date).  Health Maintenance Due  Topic Date Due   HEMOGLOBIN A1C  Never done   FOOT EXAM  Never done   OPHTHALMOLOGY EXAM  Never done   Diabetic kidney evaluation - Urine ACR  Never done   Hepatitis C Screening  Never done   DTaP/Tdap/Td (1 - Tdap) Never done   PAP SMEAR-Modifier  Never done   Diabetic kidney evaluation - eGFR measurement  06/22/2021   INFLUENZA VACCINE  02/15/2022   COVID-19 Vaccine (3 - 2023-24 season) 03/18/2022   COLONOSCOPY (Pts 45-75yr Insurance coverage will need to be confirmed)  Never done     The following portions of the patient's history were reviewed and updated as appropriate: allergies, current medications, past family history, past medical history, past social history, past surgical history and problem list.  Review of Systems Pertinent items are noted in HPI.   Objective:  BP 95/62 (BP Location: Left Arm, Patient  Position: Sitting, Cuff Size: Normal)   Pulse 72   Temp (!) 97.4 F (36.3 C)   Ht 5' 3"$  (1.6 m)   Wt 144 lb 6.4 oz (65.5 kg)   LMP 08/28/2022 (Exact Date)   BMI 25.58 kg/m  Gen: well appearing, NAD HEENT: no scleral icterus CV: RR Lung: Normal WOB Ext: warm well perfused     Assessment and Plan:   Need for emergency contraceptive care was assessed today.  Last unprotected sex was:  07/27/22 without condom; with current partner x 10 years; 1 partner in last 3 mo   Patient reported > 120 hours .  Reviewed options and patient desired No method of ECP, declined all    Contraception counseling: Reviewed methods in a patient centered fashion and used shared decision making with the patient. Utilized [employer] patient education tools as appropriate. The patient stated there goals and desires from a method are: High efficacy at preventing pregnancy  We reviewed the following methods in detail based on patient preferences available included: Hormonal Implant  Patient expressed they would like Hormonal Implant  This was provided to the patient today.  if not why not clearly documented  Risks, benefits, and typical effectiveness rates were reviewed.  Questions were answered.  Written information was also given to the patient to review.       will follow up in  prn     for surveillance.   was told to call with any further questions, or with any concerns about this method of contraception or cycle control.  Emphasized use of condoms 100% of the time for STI prevention.   1. Family planning   2. Encounter for initial prescription of implantable subdermal contraceptive Nexplanon Insertion Procedure Patient identified, informed consent performed, consent signed.   Patient does understand that irregular bleeding is a very common side effect of this medication. She was advised to have backup contraception after placement. Patient was determined to meet WHO criteria for not being pregnant.  Appropriate time out taken.  The insertion site was identified 8-10 cm (3-4 inches) from the medial epicondyle of the humerus and 3-5 cm (1.25-2 inches) posterior to (below) the sulcus (groove) between the biceps and triceps muscles of the patient's left arm and marked.  Patient was prepped with alcohol swab and then injected with 3 ml of 1% lidocaine.  Arm was prepped with chlorhexidene, Nexplanon removed from packaging,  Device confirmed in needle, then inserted full length of needle and withdrawn per handbook instructions. Nexplanon was able to palpated in the patient's arm; patient palpated the insert herself. There was minimal blood loss.  Patient insertion site covered with guaze and a pressure bandage to reduce any bruising.  The patient tolerated the procedure well and was given post procedure instructions.   - etonogestrel (NEXPLANON) implant 68 mg .achd    Please refer to After Visit Summary for other counseling recommendations.   Return if symptoms worsen or fail to improve, for Nexplanon insertion.  Herbie Saxon, [place]

## 2022-08-31 NOTE — Progress Notes (Signed)
Nexplanon card given to client. Rich Number, RN

## 2022-09-08 ENCOUNTER — Telehealth: Payer: Self-pay | Admitting: Family Medicine

## 2022-09-08 NOTE — Telephone Encounter (Signed)
Pt had a nex inserted and said she followed all the instructions at home, but something is sticking out in her arm. She would like for someone from the clinic to call her and answer her concerns. Thanks

## 2023-02-10 ENCOUNTER — Other Ambulatory Visit: Payer: BC Managed Care – PPO

## 2023-03-27 DIAGNOSIS — E782 Mixed hyperlipidemia: Secondary | ICD-10-CM | POA: Diagnosis not present

## 2023-03-27 DIAGNOSIS — E119 Type 2 diabetes mellitus without complications: Secondary | ICD-10-CM | POA: Diagnosis not present

## 2023-04-03 ENCOUNTER — Other Ambulatory Visit: Payer: Self-pay

## 2023-04-03 ENCOUNTER — Other Ambulatory Visit (HOSPITAL_COMMUNITY): Payer: Self-pay

## 2023-04-03 MED ORDER — TRULICITY 0.75 MG/0.5ML ~~LOC~~ SOAJ
0.7500 mg | SUBCUTANEOUS | 0 refills | Status: AC
  Filled 2023-04-03 – 2023-04-29 (×3): qty 2, 28d supply, fill #0

## 2023-04-03 MED ORDER — TRULICITY 0.75 MG/0.5ML ~~LOC~~ SOAJ
0.7500 mg | SUBCUTANEOUS | 0 refills | Status: DC
  Filled 2023-04-03: qty 2, 28d supply, fill #0

## 2023-04-04 ENCOUNTER — Other Ambulatory Visit (HOSPITAL_COMMUNITY): Payer: Self-pay

## 2023-04-04 ENCOUNTER — Encounter: Payer: Self-pay | Admitting: Pharmacist

## 2023-04-04 ENCOUNTER — Other Ambulatory Visit: Payer: Self-pay

## 2023-04-05 ENCOUNTER — Other Ambulatory Visit (HOSPITAL_COMMUNITY): Payer: Self-pay

## 2023-04-07 ENCOUNTER — Other Ambulatory Visit (HOSPITAL_COMMUNITY): Payer: Self-pay

## 2023-04-07 DIAGNOSIS — E119 Type 2 diabetes mellitus without complications: Secondary | ICD-10-CM | POA: Diagnosis not present

## 2023-04-07 MED ORDER — TRULICITY 1.5 MG/0.5ML ~~LOC~~ SOAJ
SUBCUTANEOUS | 1 refills | Status: DC
Start: 2023-04-07 — End: 2023-06-26
  Filled 2023-04-07: qty 2, 28d supply, fill #0
  Filled 2023-04-28 (×2): qty 2, 30d supply, fill #0
  Filled 2023-06-03: qty 2, 30d supply, fill #1

## 2023-04-10 ENCOUNTER — Other Ambulatory Visit (HOSPITAL_COMMUNITY): Payer: Self-pay

## 2023-04-10 MED ORDER — ONDANSETRON 4 MG PO TBDP
ORAL_TABLET | ORAL | 0 refills | Status: DC
  Filled 2023-04-10: qty 20, 7d supply, fill #0

## 2023-04-11 ENCOUNTER — Other Ambulatory Visit: Payer: Self-pay

## 2023-04-28 ENCOUNTER — Other Ambulatory Visit: Payer: Self-pay

## 2023-04-28 ENCOUNTER — Other Ambulatory Visit (HOSPITAL_COMMUNITY): Payer: Self-pay

## 2023-04-29 ENCOUNTER — Other Ambulatory Visit (HOSPITAL_COMMUNITY): Payer: Self-pay

## 2023-05-06 ENCOUNTER — Other Ambulatory Visit (HOSPITAL_COMMUNITY): Payer: Self-pay

## 2023-05-08 ENCOUNTER — Other Ambulatory Visit: Payer: Self-pay

## 2023-05-08 ENCOUNTER — Other Ambulatory Visit (HOSPITAL_COMMUNITY): Payer: Self-pay

## 2023-05-08 MED ORDER — ONDANSETRON 4 MG PO TBDP
4.0000 mg | ORAL_TABLET | Freq: Three times a day (TID) | ORAL | 0 refills | Status: DC | PRN
  Filled 2023-05-08: qty 20, 7d supply, fill #0

## 2023-05-28 ENCOUNTER — Emergency Department: Payer: 59

## 2023-05-28 ENCOUNTER — Emergency Department
Admission: EM | Admit: 2023-05-28 | Discharge: 2023-05-28 | Disposition: A | Discharge status: Home / Self Care | Payer: 59 | Attending: Emergency Medicine | Admitting: Emergency Medicine

## 2023-05-28 DIAGNOSIS — S3992XA Unspecified injury of lower back, initial encounter: Secondary | ICD-10-CM | POA: Diagnosis not present

## 2023-05-28 DIAGNOSIS — E119 Type 2 diabetes mellitus without complications: Secondary | ICD-10-CM | POA: Diagnosis not present

## 2023-05-28 DIAGNOSIS — S39012A Strain of muscle, fascia and tendon of lower back, initial encounter: Secondary | ICD-10-CM | POA: Insufficient documentation

## 2023-05-28 DIAGNOSIS — X501XXA Overexertion from prolonged static or awkward postures, initial encounter: Secondary | ICD-10-CM | POA: Diagnosis not present

## 2023-05-28 LAB — POC URINE PREG, ED: Preg Test, Ur: NEGATIVE

## 2023-05-28 MED ORDER — CYCLOBENZAPRINE HCL 10 MG PO TABS
10.0000 mg | ORAL_TABLET | Freq: Three times a day (TID) | ORAL | 0 refills | Status: AC | PRN
Start: 2023-05-28 — End: ?

## 2023-05-28 MED ORDER — KETOROLAC TROMETHAMINE 15 MG/ML IJ SOLN
15.0000 mg | Freq: Once | INTRAMUSCULAR | Status: AC
Start: 2023-05-28 — End: 2023-05-28
  Administered 2023-05-28: 15 mg via INTRAMUSCULAR
  Filled 2023-05-28: qty 1

## 2023-05-28 MED ORDER — DEXAMETHASONE SODIUM PHOSPHATE 10 MG/ML IJ SOLN
10.0000 mg | Freq: Once | INTRAMUSCULAR | Status: AC
  Administered 2023-05-28: 10 mg via INTRAMUSCULAR
  Filled 2023-05-28: qty 1

## 2023-05-28 NOTE — Discharge Instructions (Addendum)
You can take 650 mg of Tylenol and 600 mg of ibuprofen every 6 hours as needed for pain.  Make sure that you are taking the ibuprofen consistently for the next few days as the best treatment for this injury is anti-inflammatory medication.  You can take the Flexeril 3 times daily for muscle spasms.  Keep in mind that this medication can be sedating so do not drive after taking it.  Continue to use topical pain relievers as well as ice and heat.  I also recommend gentle stretching and massage to the area.

## 2023-05-28 NOTE — ED Provider Notes (Signed)
[facility] Provider Note    Event Date/Time   First MD Initiated Contact with Patient 05/28/23 2053     (approximate)   History   Back Pain   HPI  Cathy Ponce is a 47 y.o. female   with PMH of diabetes and hypercholesterolemia presents for evaluation of lower to mid back pain.  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  She states that her pain was not too bad yesterday but today its become pretty severe.  She has had difficulty walking due to her pain.  She denies any numbness, tingling or weakness into the legs.  No changes in bladder or bowel function, no saddle anesthesia.  She is taken Tylenol and use heating patches at home.      Physical Exam   Triage Vital Signs: ED Triage Vitals  Encounter Vitals Group     BP 05/28/23 1954 132/77     Systolic BP Percentile --      Diastolic BP Percentile --      Pulse Rate 05/28/23 1954 81     Resp 05/28/23 1954 16     Temp 05/28/23 1954 97.9 F (36.6 C)     Temp Source 05/28/23 1954 Oral     SpO2 05/28/23 1954 100 %     Weight 05/28/23 1955 142 lb (64.4 kg)     Height 05/28/23 1955 5\' 3"  (1.6 m)     Head Circumference --      Peak Flow --      Pain Score 05/28/23 1954 4     Pain Loc --      Pain Education --      Exclude from Growth Chart --     Most recent vital signs: Vitals:   05/28/23 1954  BP: 132/77  Pulse: 81  Resp: 16  Temp: 97.9 F (36.6 C)  SpO2: 100%   General: Awake, moderate distress due to pain. CV:  Good peripheral perfusion.  RRR. Resp:  Normal effort.  CTAB. Abd:  No distention.  Other:  Tenderness to palpation of the lumbar spine, negative straight leg raise, 5/5 strength in bilateral lower extremities, sensation intact across all dermatomes.   ED Results / Procedures / Treatments   Labs (all labs ordered are listed, but only abnormal results are displayed) Labs Reviewed  POC URINE  PREG, ED    RADIOLOGY  Lumbar x-rays obtained from triage, interpreted the images as well as reviewed the radiologist report which was negative for any acute abnormalities.  PROCEDURES:  Critical Care performed: No  Procedures   MEDICATIONS ORDERED IN ED: Medications  ketorolac (TORADOL) 15 MG/ML injection 15 mg (has no administration in time range)  dexamethasone (DECADRON) injection 10 mg (has no administration in time range)     IMPRESSION / MDM / ASSESSMENT AND PLAN / ED COURSE  I reviewed the triage vital signs and the nursing notes.                             47 year old female presents for evaluation of back pain that began yesterday at work.  Vital signs are stable in triage patient is in moderate distress on exam due to pain.  Differential diagnosis includes, but is not limited to, lumbar strain, sciatica, lumbar radiculopathy, ligament injury, cauda equina.  Patient's presentation is most consistent with acute  complicated illness / injury requiring diagnostic workup.  Lumbar x-rays obtained in triage, interpreted the images as well as reviewed the radiologist report which is negative for any acute abnormalities.  Aced on patient's description of her symptoms, I suspect a lumbar strain.  While in the ED she received dexamethasone and Toradol shots.  I advised her on symptomatic management using Tylenol and ibuprofen.  I will also send a prescription for a muscle relaxer.  She can use ice, heat and topical pain relievers.  We discussed gentle range of motion and stretching exercises.  She will be given a note.  She voiced understanding, all questions were answered and she was stable at discharge.     FINAL CLINICAL IMPRESSION(S) / ED DIAGNOSES   Final diagnoses:  Strain of lumbar region, initial encounter     Rx / DC Orders   ED Discharge Orders          Ordered    cyclobenzaprine (FLEXERIL) 10 MG tablet  3 times daily PRN        05/28/23 2244              Note:  This document was prepared using Dragon voice recognition software and may include unintentional dictation errors.   Cameron Ali, PA-C 05/28/23 2245    Merwyn Katos, MD 05/28/23 2337

## 2023-05-28 NOTE — ED Triage Notes (Signed)
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 CNS intact, A&O x4, VSS.

## 2023-06-01 ENCOUNTER — Other Ambulatory Visit: Payer: Self-pay

## 2023-06-03 ENCOUNTER — Other Ambulatory Visit (HOSPITAL_COMMUNITY): Payer: Self-pay

## 2023-06-05 ENCOUNTER — Other Ambulatory Visit: Payer: Self-pay

## 2023-06-06 ENCOUNTER — Other Ambulatory Visit (HOSPITAL_COMMUNITY): Payer: Self-pay

## 2023-06-06 MED ORDER — ONDANSETRON 4 MG PO TBDP
4.0000 mg | ORAL_TABLET | Freq: Three times a day (TID) | ORAL | 0 refills | Status: AC | PRN
  Filled 2023-06-06: qty 20, 7d supply, fill #0

## 2023-06-24 ENCOUNTER — Other Ambulatory Visit (HOSPITAL_COMMUNITY): Payer: Self-pay

## 2023-06-26 ENCOUNTER — Other Ambulatory Visit (HOSPITAL_COMMUNITY): Payer: Self-pay

## 2023-06-26 MED ORDER — TRULICITY 1.5 MG/0.5ML ~~LOC~~ SOAJ
1.5000 mg | SUBCUTANEOUS | 1 refills | Status: AC
  Filled 2023-06-26 – 2023-06-29 (×2): qty 2, 28d supply, fill #0
  Filled 2023-08-21: qty 2, 28d supply, fill #1

## 2023-06-30 ENCOUNTER — Other Ambulatory Visit (HOSPITAL_COMMUNITY): Payer: Self-pay

## 2023-06-30 ENCOUNTER — Other Ambulatory Visit: Payer: Self-pay

## 2023-06-30 DIAGNOSIS — E119 Type 2 diabetes mellitus without complications: Secondary | ICD-10-CM | POA: Diagnosis not present

## 2023-07-03 ENCOUNTER — Other Ambulatory Visit: Payer: Self-pay

## 2023-07-04 ENCOUNTER — Other Ambulatory Visit (HOSPITAL_COMMUNITY): Payer: Self-pay

## 2023-07-07 ENCOUNTER — Other Ambulatory Visit: Payer: Self-pay | Admitting: Family Medicine

## 2023-07-07 ENCOUNTER — Other Ambulatory Visit: Payer: Self-pay | Admitting: Specialist

## 2023-07-07 ENCOUNTER — Other Ambulatory Visit: Payer: Self-pay

## 2023-07-07 ENCOUNTER — Other Ambulatory Visit (HOSPITAL_COMMUNITY): Payer: Self-pay

## 2023-07-07 DIAGNOSIS — M5459 Other low back pain: Secondary | ICD-10-CM

## 2023-07-07 DIAGNOSIS — Z1231 Encounter for screening mammogram for malignant neoplasm of breast: Secondary | ICD-10-CM

## 2023-07-07 DIAGNOSIS — R87619 Unspecified abnormal cytological findings in specimens from cervix uteri: Secondary | ICD-10-CM | POA: Diagnosis not present

## 2023-07-07 DIAGNOSIS — Z1331 Encounter for screening for depression: Secondary | ICD-10-CM | POA: Diagnosis not present

## 2023-07-07 DIAGNOSIS — Z0000 Encounter for general adult medical examination without abnormal findings: Secondary | ICD-10-CM | POA: Diagnosis not present

## 2023-07-07 DIAGNOSIS — Z1211 Encounter for screening for malignant neoplasm of colon: Secondary | ICD-10-CM | POA: Diagnosis not present

## 2023-07-07 DIAGNOSIS — E119 Type 2 diabetes mellitus without complications: Secondary | ICD-10-CM | POA: Diagnosis not present

## 2023-07-07 DIAGNOSIS — Z124 Encounter for screening for malignant neoplasm of cervix: Secondary | ICD-10-CM | POA: Diagnosis not present

## 2023-07-07 DIAGNOSIS — E782 Mixed hyperlipidemia: Secondary | ICD-10-CM | POA: Diagnosis not present

## 2023-07-07 MED ORDER — ATORVASTATIN CALCIUM 20 MG PO TABS
20.0000 mg | ORAL_TABLET | Freq: Every day | ORAL | 3 refills | Status: AC
  Filled 2023-07-07: qty 90, 90d supply, fill #0

## 2023-07-07 MED ORDER — METHOCARBAMOL 500 MG PO TABS
500.0000 mg | ORAL_TABLET | Freq: Every evening | ORAL | 1 refills | Status: AC | PRN
  Filled 2023-07-07 – 2023-07-18 (×2): qty 30, 30d supply, fill #0
  Filled 2023-12-13: qty 30, 30d supply, fill #1

## 2023-07-07 MED ORDER — PREDNISONE 5 MG (21) PO TBPK
ORAL_TABLET | ORAL | 1 refills | Status: AC
  Filled 2023-07-07 – 2023-07-18 (×2): qty 21, 6d supply, fill #0

## 2023-07-07 MED ORDER — MELOXICAM 15 MG PO TABS
15.0000 mg | ORAL_TABLET | Freq: Every day | ORAL | 1 refills | Status: AC | PRN
  Filled 2023-07-07 – 2023-07-18 (×2): qty 30, 30d supply, fill #0

## 2023-07-07 MED ORDER — TRULICITY 1.5 MG/0.5ML ~~LOC~~ SOAJ
1.5000 mg | SUBCUTANEOUS | 11 refills | Status: AC
  Filled 2023-07-07 – 2023-07-26 (×2): qty 2, 28d supply, fill #0
  Filled 2023-08-21 – 2023-12-20 (×2): qty 2, 28d supply, fill #1

## 2023-07-08 ENCOUNTER — Ambulatory Visit: Payer: PRIVATE HEALTH INSURANCE | Attending: Physician Assistant

## 2023-07-08 ENCOUNTER — Other Ambulatory Visit: Payer: Self-pay

## 2023-07-08 DIAGNOSIS — M6281 Muscle weakness (generalized): Secondary | ICD-10-CM | POA: Insufficient documentation

## 2023-07-08 DIAGNOSIS — M5459 Other low back pain: Secondary | ICD-10-CM | POA: Diagnosis present

## 2023-07-08 NOTE — Therapy (Signed)
OUTPATIENT PHYSICAL THERAPY THORACOLUMBAR EVALUATION   Patient Name: Cathy Ponce MRN: [id number] DOB:[birth year], 47 y.o., female Today's Date: 07/08/2023  END OF SESSION:  PT End of Session - 07/08/23 1207     Visit Number 1    Date for PT Re-Evaluation 09/09/23    Authorization Type [employer]    Authorization Time Period TBD    PT Start Time 0946    PT Stop Time 1028    PT Time Calculation (min) 42 min    Activity Tolerance Patient tolerated treatment well    Behavior During Therapy [facility] for tasks assessed/performed             Past Medical History:  Diagnosis Date   Diabetes mellitus without complication (HCC)    Hypercholesterolemia    Past Surgical History:  Procedure Laterality Date   DILATION AND CURETTAGE OF UTERUS  2017   Patient Active Problem List   Diagnosis Date Noted   Subacute maxillary sinusitis 07/08/2020   Cluster headache, not intractable 07/08/2020   Diabetes 1.5, managed as type 2 (HCC) 07/08/2020    [facility]P: [facility] [place]   REFERRING PROVIDER: Ivery Quale PA-C  REFERRING DIAG: Low back pain  Rationale for Evaluation and Treatment: Rehabilitation  THERAPY DIAG:  Other low back pain  Generalized muscle weakness  ONSET DATE: 05/27/2023  SUBJECTIVE:                                                                                                                                                                                           SUBJECTIVE STATEMENT: 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 She states she was moving her patient up in the bed with the use of the bed pads. States she felt a quick, sharp pain. Patient states that initially it didn't feel that bad. She states she sat down for a few minutes to document on the encounter and then when she tried to stand up she felt a much more intense pain. States she finished working that day. She states that she has  been off work the last few weeks. States that the worst pain was the next but that it has gotten a little better since it happened.   PERTINENT HISTORY:  Diabetes  PAIN:  Are you having pain? Yes: NPRS scale: 3-6/10 Pain location: Low back and right upper thigh Pain description: sharp, stabbing pain. Can be dull ache as well  Aggravating factors: bending for self dressing, getting off toilet, squatting, walking greater than 5 minutes Relieving factors: aspercreme, biofreeze, bengay. Keeping back  straight, standing  PRECAUTIONS: Other: Universal  RED FLAGS: None   WEIGHT BEARING RESTRICTIONS: No  FALLS:  Has patient fallen in last 6 months? 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 Get back to take care of kids, be able to do house chores  NEXT MD VISIT: 07/22/2023  OBJECTIVE:  Note: Objective measures were completed at Evaluation unless otherwise noted.  DIAGNOSTIC FINDINGS:  Patient with lumbar strain and low back pain. MRI scheduled for 07/22/2023. Patient with moderate point tenderness of lumbar paraspinals, QL, and glute. Patient with poor lifting tolerance  PATIENT SURVEYS:  Modified Oswestry 20   COGNITION: Overall cognitive status: Within functional limits for tasks assessed     SENSATION: WFL   POSTURE: rounded shoulders, decreased lumbar lordosis, and posterior pelvic tilt  PALPATION: TTP right lumbar paraspinals, QL, glute med, and TFL  LUMBAR ROM:   AROM eval  Flexion Hands to knees. P!  Extension Limited with P!. Pain worse than flexion  Right lateral flexion Limited with pain  Left lateral flexion Limited with pain  Right rotation Limited with pain  Left rotation Limited with pain   (Blank rows = not tested)  LOWER EXTREMITY ROM:      Active  Right eval Left eval  Hip flexion    Hip extension    Hip abduction    Hip adduction    Hip internal rotation    Hip external rotation    Knee flexion    Knee extension    Ankle dorsiflexion    Ankle plantarflexion    Ankle inversion    Ankle eversion     (Blank rows = not tested)  LOWER EXTREMITY MMT:    MMT Right eval Left eval  Hip flexion 3-/5 3-/5  Hip extension    Hip abduction 3+/5 3+/5  Hip adduction    Hip internal rotation    Hip external rotation    Knee flexion    Knee extension    Ankle dorsiflexion    Ankle plantarflexion    Ankle inversion    Ankle eversion     (Blank rows = not tested)  LUMBAR SPECIAL TESTS:  Straight leg raise test: Positive and Slump test: Negative  FUNCTIONAL TESTS:  30 seconds chair stand test 5 reps with max use of arm rests. Staggered stance due to poor stability  GAIT: Distance walked: 100 feet Assistive device utilized: None Level of assistance: Complete Independence Comments: Walks with slow cadence and step to pattern. Excessive lateral sway  TREATMENT DATE: 07/08/2023                                                                                                                                 PATIENT EDUCATION:  Education details: Discussed objective findings with patient. Discussed POC including HEP, frequency, and anatomy/physiology of present condition Person educated: Patient Education method: Explanation, Demonstration, and Handouts Education comprehension: verbalized understanding, returned demonstration, and needs further education  HOME EXERCISE PROGRAM: Access Code: [email or web address] URL: [email or web address] Date: 07/08/2023 Prepared by: Rinaldo Ratel Reinaldo Helt  Exercises - Supine Bridge  - 2 x daily - 7 x weekly - 3 sets - 10 reps - Supine Posterior Pelvic Tilt  - 2 x daily - 7 x weekly - 3 sets - 10 reps - Supine Lower Trunk Rotation  - 2 x daily - 7 x weekly - 1-2 sets - 30  seconds hold - Seated Flexion Stretch with Swiss Ball  - 2 x daily - 7 x weekly - 3 sets - 5-10 reps - Seated Shoulder Flexion Towel Slide at Table Top  - 2 x daily - 7 x weekly - 3 sets - 10 reps  ASSESSMENT:  CLINICAL IMPRESSION: Patient is a 47 y.o. female who was seen today for physical therapy evaluation and treatment for low back pain and lumbar strain. Patient presents with moderate TTP with soft tissue restrictions/myofascial trigger points of right lumbar paraspinals, QL, glute med. Patient with poor core stability and demonstrates inability to squat and perform transfers. 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 Patient requires continued skilled PT services to address deficits and return to prior level of function.   OBJECTIVE IMPAIRMENTS: decreased mobility, difficulty walking, decreased ROM, decreased strength, and pain.   ACTIVITY LIMITATIONS: carrying, lifting, bending, sitting, squatting, stairs, transfers, and bed mobility  PARTICIPATION LIMITATIONS: cleaning, driving, occupation, and yard work  PERSONAL FACTORS: 1 comorbidity: Pain and lumbar strain  are also affecting patient's functional outcome.   REHAB POTENTIAL: Good  CLINICAL DECISION MAKING: Stable/uncomplicated  EVALUATION COMPLEXITY: Low   GOALS: Goals reviewed with patient? No  SHORT TERM GOALS: Target date: 08/05/2023    Patient will be independent with HEP to be able to improve carryover of sessions Baseline: Goal status: INITIAL  2.  Patient ODI less than 10% to improve functional tolerance Baseline: ODI 20% Goal status: INITIAL  3.  Patient will be able to demonstrate ability to perform sit to stand from chair without UE assist and no pain Baseline: Requires use of UE on arm rest to stand with staggered stance due to poor core stability  Goal status: INITIAL  4.  Patient will be able to ascend and descend stairs without UE  assist Baseline: Mod assist and pain  Goal status: INITIAL   LONG TERM GOALS: Target date: 09/02/2023    Patient will be able to squat and lift at least 30lbs with proper mechanics to be able to perform patient transfers at work Baseline: Unable to lift without increase in pain Goal status: INITIAL  2.  Patient will be able to walk greater than 1 hour without pain to be able to return to work Baseline: Pain with greater than 10 minutes Goal status: INITIAL  3.  Patient will be able to perform house cleaning chores greater than 1 hour without pain Baseline: Unable to perform Goal status: INITIAL   PLAN:  PT FREQUENCY: 2x/week  PT DURATION: 8 weeks  PLANNED INTERVENTIONS: 97164- PT Re-evaluation, 97110-Therapeutic exercises, 97530- Therapeutic activity, 97112- Neuromuscular re-education, 97535- Self Care, 64403- Manual therapy, [phone]- Gait training, [phone]- Orthotic Fit/training, [phone]- Aquatic Therapy, Patient/Family education, Balance training, Stair training, Taping, Dry Needling, Joint mobilization, Joint manipulation,  Spinal manipulation, Spinal mobilization, Cryotherapy, and Moist heat.  PLAN FOR NEXT SESSION: Continue PT services   Erskine Emery Nilah Belcourt, PT, DPT 07/08/2023, 12:12 PM

## 2023-07-10 ENCOUNTER — Other Ambulatory Visit: Payer: Self-pay

## 2023-07-10 ENCOUNTER — Encounter: Payer: Self-pay | Admitting: Pharmacist

## 2023-07-13 ENCOUNTER — Other Ambulatory Visit: Payer: Self-pay

## 2023-07-17 ENCOUNTER — Encounter: Payer: Self-pay | Admitting: Physical Therapy

## 2023-07-17 ENCOUNTER — Ambulatory Visit: Payer: PRIVATE HEALTH INSURANCE | Attending: Physician Assistant | Admitting: Physical Therapy

## 2023-07-17 VITALS — BP 110/63 | HR 75

## 2023-07-17 DIAGNOSIS — M5459 Other low back pain: Secondary | ICD-10-CM | POA: Diagnosis present

## 2023-07-17 DIAGNOSIS — M6281 Muscle weakness (generalized): Secondary | ICD-10-CM | POA: Insufficient documentation

## 2023-07-17 NOTE — Therapy (Signed)
OUTPATIENT PHYSICAL THERAPY TREATMENT   Patient Name: Cathy Ponce MRN: [id number] DOB:08-Aug-1975, 47 y.o., female Today's Date: 07/17/2023  END OF SESSION:  PT End of Session - 07/17/23 1810     Visit Number 2    Number of Visits 17    Date for PT Re-Evaluation 09/09/23    Authorization Type [employer] reporting period from 07/08/2023    Authorization Time Period Approved 6 visits    Authorization - Visit Number 2    Authorization - Number of Visits 6    Progress Note Due on Visit 10    PT Start Time 1715    PT Stop Time 1755    PT Time Calculation (min) 40 min    Activity Tolerance Patient tolerated treatment well    Behavior During Therapy [facility] for tasks assessed/performed              Past Medical History:  Diagnosis Date   Diabetes mellitus without complication (HCC)    Hypercholesterolemia    Past Surgical History:  Procedure Laterality Date   DILATION AND CURETTAGE OF UTERUS  2017   Patient Active Problem List   Diagnosis Date Noted   Subacute maxillary sinusitis 07/08/2020   Cluster headache, not intractable 07/08/2020   Diabetes 1.5, managed as type 2 (HCC) 07/08/2020    [facility]: [facility] [place]   REFERRING PROVIDER: Ivery Quale PA-C  REFERRING DIAG: Low back pain  Rationale for Evaluation and Treatment: Rehabilitation  THERAPY DIAG:  Other low back pain  Generalized muscle weakness  ONSET DATE: 05/27/2023  SUBJECTIVE:                                                                                                                                                                                           PERTINENT HISTORY:  Diabetes  SUBJECTIVE STATEMENT: Patient states she felt good after last PT session. She states learning to use her core muscle helped with taking the strain off her back. States the pain is getting better and is more annoying in her low back and if she stands or sits to long she has pain in her  proximal right thigh. She had a lot of pain after sitting a long time at church. She states she tried to get back to work on [employer] duty, but they would not take her because of concern if she had to bend to help a patient who suddenly needed help it could make her condition work. She wears a back brace and  keeps her back straight and as long as she does this it feels better. Patient does not exercise at  all at baseline. She states she felt her shift hours was a lot of exercise and she is a single parent running around a lot at home. Lumbar MRI scheduled for Saturday because doctor suspects disc injury affecting nerve root.   PAIN:  Are you having pain?  NPRS: 2/10 across low back/sacrum.   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, be able to do house chores  NEXT MD VISIT: 07/22/2023  OBJECTIVE:   Vitals:   07/17/23 1712  BP: 110/63  Pulse: 75  SpO2: 100%    SUSTAINED POSITIONS TESTING Prone lying: no problems Prone on elbows: no problems  REPEATED MOTIONS TESTING Prone press up 2x10: pain at right low back each rep (improved forward flexion by 4 inches with fingers, but aching increased on right low back during exercise) Standing lumbar extension: 1x10 less pain during but no change in ROM Standing lumbar extension with counter support: 1x2 (discontinued due to being painful)    TODAY'S TREATMENT     Therapeutic exercise: to centralize symptoms and improve ROM, strength, muscular endurance, and activity tolerance required for successful completion of functional activities.  Vitals for systems review (see above)  Asterisk sign assessment before/after interventions  throughout session to determine response: lumbar flexion (normal is fingers to ankles)   Repeated motions/sustained position testing/treatment:  Prone Lying no problems Prone on elbows no problems Prone press up, 2x10 (improved  forward flexion) Standing lumbar extension AROM: 1x10 (pain is more centralized)  Standing lumbar extension AROM over plinth: 1x2 (discontinued due to irritation).   Hooklying bridge: 1x20 (good carry over)  Hooklying lower trunk rotation 1x7  Hooklying posterior pelvic tilt, cuing for improved abdominal activation, 1x18 (discontinued due to increased pain in the low back).   Prone press up 1x10 (B UE weakness make it difficulty to perform full ROM).   Supine sciatic nerve glide, 1x15 each side (started with tensioner and switched to slider)  Pallof press 2x20 each direction with 5# cable  Education on seated posture with lumbar roll  Education on HEP including handout   Pt required multimodal cuing for proper technique and to facilitate improved neuromuscular control, strength, range of motion, and functional ability resulting in improved performance and form.   PATIENT EDUCATION:  Education details: Exercise purpose/form. Self management techniques. Education on diagnosis, prognosis, POC, anatomy and physiology of current condition. Education on HEP including handout. Person educated: Patient Education method: Explanation, Demonstration, and Handouts Education comprehension: verbalized understanding, returned demonstration, and needs further education  HOME EXERCISE PROGRAM: Access Code: [email or web address] URL: [email or web address] Date: 07/17/2023 Prepared by: Norton Blizzard  Exercises - Supine Bridge  - 2 x daily - 7 x weekly - 2 sets - 15 reps - Supine Lower Trunk Rotation  - 2 x daily - 7 x weekly - 1-2 sets - 30 seconds hold - Prone Press Up  - 2 x daily - 7 x weekly - 10-15 reps - 1 second hold - Supine 90/90 Sciatic Nerve Glide with Knee Flexion/Extension  - 2 x daily - 7 x weekly - 15 reps - Standing Anti-Rotation Press with Anchored Resistance  - 2 x daily - 7 x weekly - 2 sets - 20 reps - 5 seconds hold - Seated Correct Posture   ASSESSMENT:  CLINICAL  IMPRESSION: Patient arrives with report of improved pain but continued limitations in activity tolerance and inability to return to work. Patient's  subjective exam suggested possible lumbar extension preference, so repeated motions testing completed. Patient did have improved lumbar flexion ROM with less discomfort after repeated extension in lying twice during session. She had increasing leg symptoms with pelvic tilt exercise. Therefore, HEP updated to reflect possible extension preference and well tolerated strengthening exercises were updated. Patient also seemed to get some relief from sciatic nerve glide, so this was added to HEP. Plan to assess response to session and continue to encourage active recovery with activity staying within 3 points on NPRS during activities. Patient would benefit from continued management of limiting condition by skilled physical therapist to address remaining impairments and functional limitations to work towards stated goals and return to PLOF or maximal functional independence.   From initial PT evaluation 09/07/2022:  Patient is a 47 y.o. female who was seen today for physical therapy evaluation and treatment for low back pain and lumbar strain. Patient presents with moderate TTP with soft tissue restrictions/myofascial trigger points of right lumbar paraspinals, QL, glute med. Patient with poor core stability and demonstrates inability to squat and perform transfers. 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 Patient requires continued skilled PT services to address deficits and return to prior level of function.   OBJECTIVE IMPAIRMENTS: decreased mobility, difficulty walking, decreased ROM, decreased strength, and pain.   ACTIVITY LIMITATIONS: carrying, lifting, bending, sitting, squatting, stairs, transfers, and bed mobility  PARTICIPATION LIMITATIONS: cleaning, driving, occupation, and yard work  PERSONAL  FACTORS: 1 comorbidity: Pain and lumbar strain  are also affecting patient's functional outcome.   REHAB POTENTIAL: Good  CLINICAL DECISION MAKING: Stable/uncomplicated  EVALUATION COMPLEXITY: Low   GOALS: Goals reviewed with patient? No  SHORT TERM GOALS: Target date: 08/05/2023    Patient will be independent with HEP to be able to improve carryover of sessions Baseline: Goal status: In-progress  2.  Patient ODI less than 10% to improve functional tolerance Baseline: ODI 20% (07/08/2023);  Goal status: In progress  3.  Patient will be able to demonstrate ability to perform sit to stand from chair without UE assist and no pain Baseline: Requires use of UE on arm rest to stand with staggered stance due to poor core stability (07/08/2023);  Goal status: In progress  4.  Patient will be able to ascend and descend stairs without UE assist Baseline: Mod assist and pain (07/08/2023);  Goal status: In progress   LONG TERM GOALS: Target date: 09/02/2023    Patient will be able to squat and lift at least 30lbs with proper mechanics to be able to perform patient transfers at work Baseline: Unable to lift without increase in pain (07/08/2023);  Goal status: In progress  2.  Patient will be able to walk greater than 1 hour without pain to be able to return to work Baseline: Pain with greater than 10 minutes (07/08/2023);  Goal status: in progress  3.  Patient will be able to perform house cleaning chores greater than 1 hour without pain Baseline: Unable to perform (07/08/2023);  Goal status: In progress   PLAN:  PT FREQUENCY: 2x/week  PT DURATION: 8 weeks  PLANNED INTERVENTIONS: 97164- PT Re-evaluation, 97110-Therapeutic exercises, 97530- Therapeutic activity, 97112- Neuromuscular re-education, 97535- Self Care, 16109- Manual therapy, [phone]- Gait training, [phone]- Orthotic Fit/training, [phone]- Aquatic Therapy, Patient/Family education, Balance training, Stair training,  Taping, Dry Needling, Joint mobilization, Joint manipulation, Spinal manipulation, Spinal mobilization, Cryotherapy, and Moist heat.  PLAN FOR NEXT SESSION: update  HEP as appropriate, utilize exercises for directional preference as appropriate, progressive core/LE/functional strengthening/motor-control/muscle endurance as tolerated, education, manual therapy as needed.    Luretha Murphy. [name], PT, DPT 07/17/23, 6:20 PM  [facility] [address] [place], [place] [address] P: [phone] I F: [phone]

## 2023-07-18 ENCOUNTER — Other Ambulatory Visit (HOSPITAL_COMMUNITY): Payer: Self-pay

## 2023-07-20 ENCOUNTER — Ambulatory Visit: Payer: PRIVATE HEALTH INSURANCE | Attending: Physician Assistant | Admitting: Physical Therapy

## 2023-07-20 ENCOUNTER — Encounter: Payer: Self-pay | Admitting: Physical Therapy

## 2023-07-20 DIAGNOSIS — M5459 Other low back pain: Secondary | ICD-10-CM | POA: Insufficient documentation

## 2023-07-20 DIAGNOSIS — M6281 Muscle weakness (generalized): Secondary | ICD-10-CM | POA: Insufficient documentation

## 2023-07-20 NOTE — Patient Instructions (Signed)

## 2023-07-20 NOTE — Therapy (Signed)
 OUTPATIENT PHYSICAL THERAPY TREATMENT   Patient Name: Cathy Ponce MRN: [id number] DOB:07-19-1975, 48 y.o., female Today's Date: 07/20/2023  END OF SESSION:  PT End of Session - 07/20/23 1827     Visit Number 3    Number of Visits 17    Date for PT Re-Evaluation 09/09/23    Authorization Type [employer] reporting period from 07/08/2023    Authorization Time Period Approved 6 visits    Authorization - Visit Number 3    Authorization - Number of Visits 6    Progress Note Due on Visit 10    PT Start Time 1821    PT Stop Time 1900    PT Time Calculation (min) 39 min    Activity Tolerance Patient tolerated treatment well    Behavior During Therapy [facility] for tasks assessed/performed               Past Medical History:  Diagnosis Date   Diabetes mellitus without complication (HCC)    Hypercholesterolemia    Past Surgical History:  Procedure Laterality Date   DILATION AND CURETTAGE OF UTERUS  2017   Patient Active Problem List   Diagnosis Date Noted   Subacute maxillary sinusitis 07/08/2020   Cluster headache, not intractable 07/08/2020   Diabetes 1.5, managed as type 2 (HCC) 07/08/2020    [facility]P: [facility] [place]   REFERRING PROVIDER: Armida Culver PA-C  REFERRING DIAG: Low back pain  Rationale for Evaluation and Treatment: Rehabilitation  THERAPY DIAG:  Other low back pain  Generalized muscle weakness  ONSET DATE: 05/27/2023  SUBJECTIVE:                                                                                                                                                                                           PERTINENT HISTORY:  Diabetes  SUBJECTIVE STATEMENT: Patient states her back is a lot better. She had soreness from stretching after last PT session but not her usual pain. She states she had some achy pain this morning but it is gone now. She only did the HEP once. It went okay and didn't have any issue.   PAIN:  Are  you having pain?  NPRS: 1/10 across low back/sacrum, central.   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 Get back to take care of kids, be able to do house chores  NEXT MD VISIT: 07/22/2023  OBJECTIVE:   Asterisk sign (lumbar flexion): fingers to distal 4th of shin at start of visit, improvement to almost ankles by end of visit.    TODAY'S  TREATMENT     Therapeutic exercise: to centralize symptoms and improve ROM, strength, muscular endurance, and activity tolerance required for successful completion of functional activities.  Treadmill 3 mph at 0% grade with no lasting UE support. For improved lower extremity mobility, muscular endurance, and weightbearing activity tolerance; and to induce the analgesic effect of aerobic exercise, stimulate improved joint nutrition, and prepare body structures and systems for following interventions. x 5  minutes. Required min assistance to set up and control machine. .  Asterisk sign assessment before/after interventions  throughout session to determine response: lumbar flexion (normal is fingers to ankles)   Prone press up, 2x10  Pt required multimodal cuing for proper technique and to facilitate improved neuromuscular control, strength, range of motion, and functional ability resulting in improved performance and form.  Manual therapy: to reduce pain and tissue tension, improve range of motion, neuromodulation, in order to promote improved ability to complete functional activities. PRONE CPA grade III-IV with and without KE wedge assist at L3, L4, and L5, 2x30 seconds each segment.   STM to bilateral lumbar paraspinal muscles and QL with skin to skin contact and freeup lotion. Noted for muscle tension R > L    Trigger Point Dry Needling  Initial Treatment: Pt instructed on Dry Needling rational, procedures, and possible side effects. Pt instructed to expect mild to moderate muscle soreness  later in the day and/or into the next day.  Pt instructed in methods to reduce muscle soreness. Pt instructed to continue prescribed HEP. Patient was educated on signs and symptoms of infection and other risk factors and advised to seek medical attention should they occur.  Patient verbalized understanding of these instructions and education.   Patient Verbal Consent Given: Yes Education Handout Provided: Yes Muscles Treated: bilateral multifidi at L4 and L5 levels to decrease pain and spasms along patient's low back region with patient in prone utilizing 4 dry needle(s) .30mm x 60mm with 4 sticks. Electrical Stimulation Performed: No Treatment Response/Outcome: light bleeding upon removal of needle at right L5 location, resolved with sustained pressure. Well tolerated overall. Pain abolished following.    PATIENT EDUCATION:  Education details: Exercise purpose/form. Self management techniques. Education on diagnosis, prognosis, POC, anatomy and physiology of current condition. Dry needling (see above and Patient Instructions attached to this note).  Person educated: Patient Education method: Explanation, Demonstration, and Handouts Education comprehension: verbalized understanding, returned demonstration, and needs further education  HOME EXERCISE PROGRAM: Access Code: [email or web address] URL: [email or web address] Date: 07/17/2023 Prepared by: Camie Cleverly  Exercises - Supine Bridge  - 2 x daily - 7 x weekly - 2 sets - 15 reps - Supine Lower Trunk Rotation  - 2 x daily - 7 x weekly - 1-2 sets - 30 seconds hold - Prone Press Up  - 2 x daily - 7 x weekly - 10-15 reps - 1 second hold - Supine 90/90 Sciatic Nerve Glide with Knee Flexion/Extension  - 2 x daily - 7 x weekly - 15 reps - Standing Anti-Rotation Press with Anchored Resistance  - 2 x daily - 7 x weekly - 2 sets - 20 reps - 5 seconds hold - Seated Correct Posture   ASSESSMENT:  CLINICAL IMPRESSION: Patient arrives  reporting minimal participation in extension exercises since last PT session but with improved pain and function. She demonstrated better lumbar extension ROM this visit. Continued with exercises for extension preference, reinforced by joint mobilizations. Patient then underwent dry needling with STM resulting in resolution of pain where patinet  states she feels normal again. Patient would benefit from pairing these interventions with those of longer term effects such as motor control, muscular strength, and muscular endurance exercises next session. Patient would benefit from continued management of limiting condition by skilled physical therapist to address remaining impairments and functional limitations to work towards stated goals and return to PLOF or maximal functional independence.  From initial PT evaluation 09/07/2022:  Patient is a 48 y.o. female who was seen today for physical therapy evaluation and treatment for low back pain and lumbar strain. Patient presents with moderate TTP with soft tissue restrictions/myofascial trigger points of right lumbar paraspinals, QL, glute med. Patient with poor core stability and demonstrates inability to squat and perform transfers. 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 Patient requires continued skilled PT services to address deficits and return to prior level of function.   OBJECTIVE IMPAIRMENTS: decreased mobility, difficulty walking, decreased ROM, decreased strength, and pain.   ACTIVITY LIMITATIONS: carrying, lifting, bending, sitting, squatting, stairs, transfers, and bed mobility  PARTICIPATION LIMITATIONS: cleaning, driving, occupation, and yard work  PERSONAL FACTORS: 1 comorbidity: Pain and lumbar strain  are also affecting patient's functional outcome.   REHAB POTENTIAL: Good  CLINICAL DECISION MAKING: Stable/uncomplicated  EVALUATION COMPLEXITY: Low   GOALS: Goals reviewed with  patient? No  SHORT TERM GOALS: Target date: 08/05/2023    Patient will be independent with HEP to be able to improve carryover of sessions Baseline: Goal status: In-progress  2.  Patient ODI less than 10% to improve functional tolerance Baseline: ODI 20% (07/08/2023);  Goal status: In progress  3.  Patient will be able to demonstrate ability to perform sit to stand from chair without UE assist and no pain Baseline: Requires use of UE on arm rest to stand with staggered stance due to poor core stability (07/08/2023);  Goal status: In progress  4.  Patient will be able to ascend and descend stairs without UE assist Baseline: Mod assist and pain (07/08/2023);  Goal status: In progress   LONG TERM GOALS: Target date: 09/02/2023    Patient will be able to squat and lift at least 30lbs with proper mechanics to be able to perform patient transfers at work Baseline: Unable to lift without increase in pain (07/08/2023);  Goal status: In progress  2.  Patient will be able to walk greater than 1 hour without pain to be able to return to work Baseline: Pain with greater than 10 minutes (07/08/2023);  Goal status: in progress  3.  Patient will be able to perform house cleaning chores greater than 1 hour without pain Baseline: Unable to perform (07/08/2023);  Goal status: In progress   PLAN:  PT FREQUENCY: 2x/week  PT DURATION: 8 weeks  PLANNED INTERVENTIONS: 97164- PT Re-evaluation, 97110-Therapeutic exercises, 97530- Therapeutic activity, 97112- Neuromuscular re-education, 97535- Self Care, 02859- Manual therapy, [phone]- Gait training, [phone]- Orthotic Fit/training, [phone]- Aquatic Therapy, Patient/Family education, Balance training, Stair training, Taping, Dry Needling, Joint mobilization, Joint manipulation, Spinal manipulation, Spinal mobilization, Cryotherapy, and Moist heat.  PLAN FOR NEXT SESSION: update HEP as appropriate, utilize exercises for directional preference as  appropriate, progressive core/LE/functional strengthening/motor-control/muscle endurance as tolerated, education, manual therapy as needed.    Camie SAUNDERS. Juli, PT, DPT 07/20/23, 7:31 PM  [facility] [address] [place], [place] [address] P: [phone] I F: [phone]

## 2023-07-22 ENCOUNTER — Ambulatory Visit
Admission: RE | Admit: 2023-07-22 | Discharge: 2023-07-22 | Disposition: A | Discharge status: Home / Self Care | Payer: 59 | Source: Ambulatory Visit | Attending: Specialist | Admitting: Specialist

## 2023-07-22 DIAGNOSIS — M5137 Other intervertebral disc degeneration, lumbosacral region with discogenic back pain only: Secondary | ICD-10-CM | POA: Diagnosis not present

## 2023-07-22 DIAGNOSIS — M5459 Other low back pain: Secondary | ICD-10-CM | POA: Insufficient documentation

## 2023-07-22 DIAGNOSIS — M5127 Other intervertebral disc displacement, lumbosacral region: Secondary | ICD-10-CM | POA: Diagnosis not present

## 2023-07-25 ENCOUNTER — Encounter: Payer: Self-pay | Admitting: Physical Therapy

## 2023-07-25 ENCOUNTER — Ambulatory Visit: Payer: PRIVATE HEALTH INSURANCE | Admitting: Physical Therapy

## 2023-07-25 DIAGNOSIS — M6281 Muscle weakness (generalized): Secondary | ICD-10-CM

## 2023-07-25 DIAGNOSIS — M5459 Other low back pain: Secondary | ICD-10-CM | POA: Diagnosis not present

## 2023-07-25 NOTE — Therapy (Signed)
 OUTPATIENT PHYSICAL THERAPY TREATMENT   Patient Name: Cathy Ponce MRN: [id number] DOB:1976/06/29, 48 y.o., female Today's Date: 07/25/2023  END OF SESSION:  PT End of Session - 07/25/23 1608     Visit Number 4    Number of Visits 17    Date for PT Re-Evaluation 09/09/23    Authorization Type [facility] reporting period from 07/08/2023    Authorization Time Period Approved 6 visits    Authorization - Visit Number 4    Authorization - Number of Visits 6    Progress Note Due on Visit 10    PT Start Time 1605    PT Stop Time 1645    PT Time Calculation (min) 40 min    Activity Tolerance Patient tolerated treatment well    Behavior During Therapy [facility] for tasks assessed/performed                Past Medical History:  Diagnosis Date   Diabetes mellitus without complication (HCC)    Hypercholesterolemia    Past Surgical History:  Procedure Laterality Date   DILATION AND CURETTAGE OF UTERUS  2017   Patient Active Problem List   Diagnosis Date Noted   Subacute maxillary sinusitis 07/08/2020   Cluster headache, not intractable 07/08/2020   Diabetes 1.5, managed as type 2 (HCC) 07/08/2020    [facility]: [facility] [place]   REFERRING PROVIDER: Armida Culver PA-C  REFERRING DIAG: Low back pain  Rationale for Evaluation and Treatment: Rehabilitation  THERAPY DIAG:  Other low back pain  Generalized muscle weakness  ONSET DATE: 05/27/2023  SUBJECTIVE:                                                                                                                                                                                           PERTINENT HISTORY:  Diabetes  SUBJECTIVE STATEMENT: Patient states her back has been so much better since she did dry needling. She had a slight dull pain yesterday and  today that is intermittent. The pain is across her low back and right anterior thigh. HEP has been going well and she is feeling so much more normal  now. She feels the dry needling helped the most so far.   PAIN:  Are you having pain?  NPRS: 1/10 across low back/sacrum, right anterior thigh.   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    TODAY'S TREATMENT     Therapeutic exercise: to centralize symptoms and improve ROM, strength, muscular endurance, and activity tolerance required for successful completion of functional activities.  Treadmill 3 mph at 0% grade with no lasting UE support. For improved lower extremity mobility, muscular endurance, and weightbearing activity tolerance; and to induce the analgesic effect of aerobic exercise, stimulate improved joint nutrition, and prepare body structures and systems for following interventions. x 5  minutes. Required min assistance to set up and control machine.  Prone press up, 2x10 (before and after squats and deadlift)  Education on HEP including handout   Therapeutic activities: dynamic activities for functional strengthening and improved functional activity tolerance. Goblet squat to buttocks tap on 17 inch chair 1x10 with 10#KB 2x10 with 20#KB Deadlift with KB 1x10 with 10#KB 2x10 with 20#KB (cuing for improved neutral spine - less flexion- improved comfort)  Pt required multimodal cuing for proper technique and to facilitate improved neuromuscular control, strength, range of motion, and functional ability resulting in improved performance and form.  Manual therapy: to reduce pain and tissue tension, improve range of motion, neuromodulation, in order to promote improved ability to complete functional activities. PRONE STM to bilateral lumbar paraspinal muscles and QL with skin to skin contact and  freeup lotion. Noted for muscle tension L > R   Trigger Point Dry Needling  Subsequent Treatment: Instructions provided previously at initial dry needling treatment.  Instructions reviewed, if requested by the patient, prior to subsequent dry needling treatment.   Patient Verbal Consent Given: Yes Education Handout Provided: Previously Provided Muscles Treated:  bilateral lumbar multifidi at L5 (2 sticks each side), and L4 (1 stick each side) to decrease pain and spasms along patient's low back and hip region with patient in prone utilizing 4 dry needle(s) .30mm x 60mm. Electrical Stimulation Performed: No Treatment Response/Outcome: Strong muscle contraction response first time at L5 level (L > R) resulting in patient preferring L needle be removed. Improved tolerance at L4 level and repeated sticks at L5 now tolerable. Patient's concordant pain abolished after needling.    PATIENT EDUCATION:  Education details: Exercise purpose/form. Self management techniques. Education on diagnosis, prognosis, POC, anatomy and physiology of current condition. Dry needling (see above and Patient Instructions attached to this note).  Person educated: Patient Education method: Explanation, Demonstration, and Handouts Education comprehension: verbalized understanding, returned demonstration, and needs further education  HOME EXERCISE PROGRAM: Access Code: [email or web address] URL: [email or web address] Date: 07/25/2023 Prepared by: Camie Cleverly  Exercises - Supine Bridge  - 2 x daily - 7 x weekly - 2 sets - 15 reps - Supine Lower Trunk Rotation  - 2 x daily - 7 x weekly - 1-2 sets - 30 seconds hold - Prone Press Up  - 2 x daily - 7 x weekly - 10-15 reps - 1 second hold - Supine 90/90 Sciatic Nerve Glide with Knee Flexion/Extension  - 2 x daily - 7 x weekly - 15 reps - Standing Anti-Rotation Press with Anchored Resistance  - 2 x daily - 7 x weekly - 2 sets - 20 reps - 5 seconds hold - Seated Correct  Posture  - Goblet Squat with Kettlebell  - 3 x weekly - 3 sets - 10 reps - Kettlebell Deadlift  - 3 x weekly - 3 sets - 10 reps  ASSESSMENT:  CLINICAL IMPRESSION: Patient arrives reporting significant improvement in pain. Utilized dry needling again for pain  relief followed by progressing to squatting and lifting with up to 20#. Patient tolerated well but did have mild discomfort if she did not keep her back out of slight flexion during deadlift. She is hopeful she can go back to work soon. Plan to continue with needling as needed for pain control and progressive strengthening and graded exercise/exposure to return to prior level of function. Patient would benefit from continued management of limiting condition by skilled physical therapist to address remaining impairments and functional limitations to work towards stated goals and return to PLOF or maximal functional independence.   From initial PT evaluation 09/07/2022:  Patient is a 48 y.o. female who was seen today for physical therapy evaluation and treatment for low back pain and lumbar strain. Patient presents with moderate TTP with soft tissue restrictions/myofascial trigger points of right lumbar paraspinals, QL, glute med. Patient with poor core stability and demonstrates inability to squat and perform transfers. 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 Patient requires continued skilled PT services to address deficits and return to prior level of function.   OBJECTIVE IMPAIRMENTS: decreased mobility, difficulty walking, decreased ROM, decreased strength, and pain.   ACTIVITY LIMITATIONS: carrying, lifting, bending, sitting, squatting, stairs, transfers, and bed mobility  PARTICIPATION LIMITATIONS: cleaning, driving, occupation, and yard work  PERSONAL FACTORS: 1 comorbidity: Pain and lumbar strain  are also affecting patient's functional outcome.   REHAB POTENTIAL: Good  CLINICAL  DECISION MAKING: Stable/uncomplicated  EVALUATION COMPLEXITY: Low   GOALS: Goals reviewed with patient? No  SHORT TERM GOALS: Target date: 08/05/2023    Patient will be independent with HEP to be able to improve carryover of sessions Baseline: Goal status: In-progress  2.  Patient ODI less than 10% to improve functional tolerance Baseline: ODI 20% (07/08/2023);  Goal status: In progress  3.  Patient will be able to demonstrate ability to perform sit to stand from chair without UE assist and no pain Baseline: Requires use of UE on arm rest to stand with staggered stance due to poor core stability (07/08/2023);  Goal status: In progress  4.  Patient will be able to ascend and descend stairs without UE assist Baseline: Mod assist and pain (07/08/2023);  Goal status: In progress   LONG TERM GOALS: Target date: 09/02/2023    Patient will be able to squat and lift at least 30lbs with proper mechanics to be able to perform patient transfers at work Baseline: Unable to lift without increase in pain (07/08/2023);  Goal status: In progress  2.  Patient will be able to walk greater than 1 hour without pain to be able to return to work Baseline: Pain with greater than 10 minutes (07/08/2023);  Goal status: in progress  3.  Patient will be able to perform house cleaning chores greater than 1 hour without pain Baseline: Unable to perform (07/08/2023);  Goal status: In progress   PLAN:  PT FREQUENCY: 2x/week  PT DURATION: 8 weeks  PLANNED INTERVENTIONS: 97164- PT Re-evaluation, 97110-Therapeutic exercises, 97530- Therapeutic activity, 97112- Neuromuscular re-education, 97535- Self Care, 02859- Manual therapy, [phone]- Gait training, [phone]- Orthotic Fit/training, [phone]- Aquatic Therapy, Patient/Family education, Balance training, Stair training, Taping, Dry Needling, Joint mobilization, Joint manipulation, Spinal manipulation, Spinal mobilization, Cryotherapy, and Moist heat.  PLAN  FOR NEXT SESSION: update HEP as appropriate, utilize exercises for directional preference as appropriate, progressive core/LE/functional strengthening/motor-control/muscle endurance as tolerated, education, manual therapy as needed.    Camie SAUNDERS. Juli,  PT, DPT 07/25/23, 8:32 PM  [facility] [address] [place], [place] [address] P: [phone] I F: [phone]

## 2023-07-26 ENCOUNTER — Other Ambulatory Visit: Payer: Self-pay

## 2023-07-27 ENCOUNTER — Encounter: Payer: Self-pay | Admitting: Physical Therapy

## 2023-07-27 ENCOUNTER — Ambulatory Visit: Payer: PRIVATE HEALTH INSURANCE | Attending: Physician Assistant | Admitting: Physical Therapy

## 2023-07-27 DIAGNOSIS — M5459 Other low back pain: Secondary | ICD-10-CM | POA: Insufficient documentation

## 2023-07-27 DIAGNOSIS — M6281 Muscle weakness (generalized): Secondary | ICD-10-CM | POA: Insufficient documentation

## 2023-07-27 NOTE — Therapy (Signed)
 OUTPATIENT PHYSICAL THERAPY TREATMENT   Patient Name: Cathy Ponce MRN: [id number] DOB:[birth year], 48 y.o., female Today's Date: 07/27/2023  END OF SESSION:  PT End of Session - 07/27/23 1516     Visit Number 5    Number of Visits 17    Date for PT Re-Evaluation 09/09/23    Authorization Type [facility] reporting period from 07/08/2023    Authorization Time Period Approved 6 visits    Authorization - Visit Number 5    Authorization - Number of Visits 6    Progress Note Due on Visit 10    PT Start Time 1521    PT Stop Time 1559    PT Time Calculation (min) 38 min    Activity Tolerance Patient tolerated treatment well    Behavior During Therapy [facility] for tasks assessed/performed                 Past Medical History:  Diagnosis Date   Diabetes mellitus without complication (HCC)    Hypercholesterolemia    Past Surgical History:  Procedure Laterality Date   DILATION AND CURETTAGE OF UTERUS  2017   Patient Active Problem List   Diagnosis Date Noted   Subacute maxillary sinusitis 07/08/2020   Cluster headache, not intractable 07/08/2020   Diabetes 1.5, managed as type 2 (HCC) 07/08/2020    [facility]P: [facility] [place]   REFERRING PROVIDER: Armida Culver PA-C  REFERRING DIAG: Low back pain  Rationale for Evaluation and Treatment: Rehabilitation  THERAPY DIAG:  Other low back pain  Generalized muscle weakness  ONSET DATE: 05/27/2023  SUBJECTIVE:                                                                                                                                                                                           PERTINENT HISTORY:  Diabetes  SUBJECTIVE STATEMENT: Patient states she felt good until doing squats today. She felt a dull pain 1-2/10 across both sides of the low back and down the right anterior thigh after performing squats. Pain lasted for about 45 minutes. She reports she did her prone press ups prior to  squatting but not after. She reports experiencing mild discomfort at needle sight after last session but her typical pain was not present.    PAIN:  Are you having pain?  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 Get back to take care of kids, be able to do house chores  NEXT MD VISIT: 07/22/2023  OBJECTIVE:   Asterisk sign (lumbar flexion): fingers to distal  4th of shin at start of visit, improvement to almost ankles by end of visit.    TODAY'S TREATMENT     Therapeutic exercise: to centralize symptoms and improve ROM, strength, muscular endurance, and activity tolerance required for successful completion of functional activities.  Treadmill 3 mph at 0% grade with no lasting UE support. For improved lower extremity mobility, muscular endurance, and weightbearing activity tolerance; and to induce the analgesic effect of aerobic exercise, stimulate improved joint nutrition, and prepare body structures and systems for following interventions. x 5  minutes. Required min assistance to set up and control machine.  Prone press up, 2x10 (before and after squats and deadlift)  Pallof Press with 5# cable   1x10 each side 1x12  with anchor to right of body - stopped set due to increase in pain to 3/10. Pain worse on left side than the right.  Prone press up with yoga blocks, 1x10 (after pallof press) - aching improved to 1/10  Therapeutic activities: dynamic activities for functional strengthening and improved functional activity tolerance. Goblet squat to buttocks tap on 17 inch chair 2x10 with 10#KB 2x10 with 20#KB Deadlift with KB 1x10 with 10#KB 3x10 with 20#KB (cuing for improved neutral spine - less flexion- improved comfort). Increase in ache after correcting form to increase hip flexion. Felt ache more on the right than left side. Ache/pain resolved with rest.     Pt required multimodal cuing for proper  technique and to facilitate improved neuromuscular control, strength, range of motion, and functional ability resulting in improved performance and form.   PATIENT EDUCATION:  Education details: Exercise purpose/form. Self management techniques.  Person educated: Patient Education method: Explanation, Demonstration, and Verbal cues Education comprehension: verbalized understanding, returned demonstration, and needs further education  HOME EXERCISE PROGRAM: Access Code: [email or web address] URL: [email or web address] Date: 07/25/2023 Prepared by: Camie Cleverly  Exercises - Supine Bridge  - 2 x daily - 7 x weekly - 2 sets - 15 reps - Supine Lower Trunk Rotation  - 2 x daily - 7 x weekly - 1-2 sets - 30 seconds hold - Prone Press Up  - 2 x daily - 7 x weekly - 10-15 reps - 1 second hold - Supine 90/90 Sciatic Nerve Glide with Knee Flexion/Extension  - 2 x daily - 7 x weekly - 15 reps - Standing Anti-Rotation Press with Anchored Resistance  - 2 x daily - 7 x weekly - 2 sets - 20 reps - 5 seconds hold - Seated Correct Posture  - Goblet Squat with Kettlebell  - 3 x weekly - 3 sets - 10 reps - Kettlebell Deadlift  - 3 x weekly - 3 sets - 10 reps  ASSESSMENT:  CLINICAL IMPRESSION: Patient arrives reporting significant improvement in pain. She benefited from performing prone press ups before and after squatting and lifting to increase mobility and improve symptoms. She experienced an increase in discomfort while performing her second set of pallof press with 5# cable, so discontinued exercise and followed it with prone press ups. The pain did not improve with first set of prone press ups, but did improve from 3/10 to 1/10 after a second set with hands elevated on yoga blocks. Patient also it felt good to perform with yoga blocks and she spent more time in extended position during reps. Encouraged patient to continue prone press ups at home and before/after loaded HEP. Verbal cuing was provided to  modify form by reducing lateral lean and prompt core activation. Plan to progressively increase repetitions  until achieving a set of 20 reps for muscular endurance. Patient would benefit from continued management of limiting condition by skilled physical therapist to address remaining impairments and functional limitations to work towards stated goals and return to PLOF or maximal functional independence.   From initial PT evaluation 09/07/2022:  Patient is a 48 y.o. female who was seen today for physical therapy evaluation and treatment for low back pain and lumbar strain. Patient presents with moderate TTP with soft tissue restrictions/myofascial trigger points of right lumbar paraspinals, QL, glute med. Patient with poor core stability and demonstrates inability to squat and perform transfers. 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 Patient requires continued skilled PT services to address deficits and return to prior level of function.   OBJECTIVE IMPAIRMENTS: decreased mobility, difficulty walking, decreased ROM, decreased strength, and pain.   ACTIVITY LIMITATIONS: carrying, lifting, bending, sitting, squatting, stairs, transfers, and bed mobility  PARTICIPATION LIMITATIONS: cleaning, driving, occupation, and yard work  PERSONAL FACTORS: 1 comorbidity: Pain and lumbar strain  are also affecting patient's functional outcome.   REHAB POTENTIAL: Good  CLINICAL DECISION MAKING: Stable/uncomplicated  EVALUATION COMPLEXITY: Low   GOALS: Goals reviewed with patient? No  SHORT TERM GOALS: Target date: 08/05/2023    Patient will be independent with HEP to be able to improve carryover of sessions Baseline: Goal status: In-progress  2.  Patient ODI less than 10% to improve functional tolerance Baseline: ODI 20% (07/08/2023);  Goal status: In progress  3.  Patient will be able to demonstrate ability to perform sit to stand from chair  without UE assist and no pain Baseline: Requires use of UE on arm rest to stand with staggered stance due to poor core stability (07/08/2023);  Goal status: In progress  4.  Patient will be able to ascend and descend stairs without UE assist Baseline: Mod assist and pain (07/08/2023);  Goal status: In progress   LONG TERM GOALS: Target date: 09/02/2023    Patient will be able to squat and lift at least 30lbs with proper mechanics to be able to perform patient transfers at work Baseline: Unable to lift without increase in pain (07/08/2023);  Goal status: In progress  2.  Patient will be able to walk greater than 1 hour without pain to be able to return to work Baseline: Pain with greater than 10 minutes (07/08/2023);  Goal status: in progress  3.  Patient will be able to perform house cleaning chores greater than 1 hour without pain Baseline: Unable to perform (07/08/2023);  Goal status: In progress   PLAN:  PT FREQUENCY: 2x/week  PT DURATION: 8 weeks  PLANNED INTERVENTIONS: 97164- PT Re-evaluation, 97110-Therapeutic exercises, 97530- Therapeutic activity, 97112- Neuromuscular re-education, 97535- Self Care, 02859- Manual therapy, [phone]- Gait training, [phone]- Orthotic Fit/training, [phone]- Aquatic Therapy, Patient/Family education, Balance training, Stair training, Taping, Dry Needling, Joint mobilization, Joint manipulation, Spinal manipulation, Spinal mobilization, Cryotherapy, and Moist heat.  PLAN FOR NEXT SESSION: update HEP as appropriate, utilize exercises for directional preference as appropriate, progressive core/LE/functional strengthening/motor-control/muscle endurance as tolerated, education, manual therapy as needed.   Kamyla Olejnik, SPT [employer] DPTE   Camie R. Juli, PT, DPT 07/27/23, 4:38 PM  [facility] [address] [place], [place] [address] P: [phone] I F: [phone]

## 2023-07-31 ENCOUNTER — Ambulatory Visit: Payer: PRIVATE HEALTH INSURANCE | Admitting: Physical Therapy

## 2023-07-31 DIAGNOSIS — M6281 Muscle weakness (generalized): Secondary | ICD-10-CM

## 2023-07-31 DIAGNOSIS — M5459 Other low back pain: Secondary | ICD-10-CM | POA: Diagnosis not present

## 2023-07-31 NOTE — Therapy (Signed)
 OUTPATIENT PHYSICAL THERAPY TREATMENT   Patient Name: Cathy Ponce MRN: [id number] DOB:June 14, 1976, 48 y.o., female Today's Date: 08/01/2023  END OF SESSION:  PT End of Session - 07/31/23 1908     Visit Number 6    Number of Visits 17    Date for PT Re-Evaluation 09/09/23    Authorization Type [facility] [employer] reporting period from 07/08/2023    Authorization Time Period Approved 6 visits    Authorization - Visit Number 6    Authorization - Number of Visits 6    Progress Note Due on Visit 10    PT Start Time 1735    PT Stop Time 1815    PT Time Calculation (min) 40 min    Activity Tolerance Patient tolerated treatment well    Behavior During Therapy [facility] for tasks assessed/performed              Past Medical History:  Diagnosis Date   Diabetes mellitus without complication (HCC)    Hypercholesterolemia    Past Surgical History:  Procedure Laterality Date   DILATION AND CURETTAGE OF UTERUS  2017   Patient Active Problem List   Diagnosis Date Noted   Subacute maxillary sinusitis 07/08/2020   Cluster headache, not intractable 07/08/2020   Diabetes 1.5, managed as type 2 (HCC) 07/08/2020    [facility]P: [facility] [place]   REFERRING PROVIDER: Armida Culver PA-C  REFERRING DIAG: Low back pain  Rationale for Evaluation and Treatment: Rehabilitation  THERAPY DIAG:  Other low back pain  Generalized muscle weakness  ONSET DATE: 05/27/2023  SUBJECTIVE:                                                                                                                                                                                           PERTINENT HISTORY:  Diabetes  SUBJECTIVE STATEMENT: Patient states she has increased pain today. She states she started having pain after watching TV at after lunch. She had played in the snow with her kids on Saturday without a problem. She was feeling a lot better since last PT session, so she was trying to do normal  things. She has been laying in the bed mostly because sitting bothers her. She did a lot of prone press ups without improvement.  She states she got back in touch with her work comp sports coach from [employer], who agreed to approve more visits for January (which is as long as they will be involved in her work comp case which is transitioning to another insurance entity). She states she got her MRI results back, but has not spoken to the doctor.  PAIN:  Are you having pain?  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    IMAGING  MRI report from 07/22/2023:  CLINICAL DATA:  Low back pain radiating to the right hip for the past month after lifting injury.   EXAM: MRI LUMBAR SPINE WITHOUT CONTRAST   TECHNIQUE: Multiplanar, multisequence MR imaging of the lumbar spine was performed. No intravenous contrast was administered.   COMPARISON:  Lumbar spine x-rays dated May 28, 2023.   FINDINGS: Segmentation:  Standard.   Alignment:  Physiologic.   Vertebrae:  No fracture, evidence of discitis, or bone lesion.   Conus medullaris and cauda equina: Conus extends to the L1 level. Conus and cauda equina appear normal.   Paraspinal and other soft tissues: Negative.   Disc levels:   T12-L1 to L4-L5:  Negative.   L5-S1: Mild disc bulging with superimposed shallow central disc protrusion, encroaching on the descending right S1 nerve root. Mild right lateral recess stenosis. No spinal canal or neuroforaminal stenosis.   IMPRESSION: 1. Shallow central disc protrusion at L5-S1, encroaching on the descending right S1 nerve root.    TODAY'S TREATMENT      Therapeutic exercise: to centralize symptoms and improve ROM, strength, muscular endurance, and activity tolerance required for successful completion of functional activities.  Treadmill 2 mph at 0% grade with no lasting UE support. For improved lower extremity mobility, muscular endurance, and weightbearing activity tolerance; and to induce the analgesic effect of aerobic exercise, stimulate improved joint nutrition, and prepare body structures and systems for following interventions. x 5  minutes. Required min assistance to set up and control machine.  Prone press up, 3x10 with hands on 4 inch yoga blocks using lock and sag technique (pain centralized to low back and decreased, plateau after 3 sets).   Pt required multimodal cuing for proper technique and to facilitate improved neuromuscular control, strength, range of motion, and functional ability resulting in improved performance and form.  Manual therapy: to reduce pain and tissue tension, improve range of motion, neuromodulation, in order to promote improved ability to complete functional activities. PRONE Prone press up with clinician overpressure, 1x10  STM to lumbar paraspinals CPA to lumbar segments, grade III-IV (feels good, best at L5).    Trigger Point Dry Needling  Subsequent Treatment: Instructions provided previously at initial dry needling treatment.  Instructions reviewed, if requested by the patient, prior to subsequent dry needling treatment.   Patient Verbal Consent Given: Yes Education Handout Provided: Previously Provided Muscles Treated:  4 dry needle(s) .30mm x 60mm inserted into bilateral lumbar multifidi muscles at L4 and L5 levels with patient in prone position to decrease pain and spasms along patient's low back and right leg region. Electrical Stimulation Performed: No Treatment Response/Outcome: Patient felt appropriate deep ache and muscle twitch at treatment locations, left > right, and reported concordant  low back pain resolved after needling.   PATIENT EDUCATION:  Education details: Exercise purpose/form. Self management techniques. Dry needling. Education on diagnosis, prognosis, POC, anatomy and physiology of current condition in regards to new MRI results. Education on HEP.   Person educated: Patient Education method:  Explanation, Demonstration, and Verbal cues Education comprehension: verbalized understanding, returned demonstration, and needs further education  HOME EXERCISE PROGRAM: Access Code: [email or web address] URL: [email or web address] Date: 07/25/2023 Prepared by: Camie Cleverly  Exercises - Supine Bridge  - 2 x daily - 7 x weekly - 2 sets - 15 reps - Supine Lower Trunk Rotation  - 2 x daily - 7 x weekly - 1-2 sets - 30 seconds hold - Prone Press Up  - 2 x daily - 7 x weekly - 10-15 reps - 1 second hold - Supine 90/90 Sciatic Nerve Glide with Knee Flexion/Extension  - 2 x daily - 7 x weekly - 15 reps - Standing Anti-Rotation Press with Anchored Resistance  - 2 x daily - 7 x weekly - 2 sets - 20 reps - 5 seconds hold - Seated Correct Posture  - Goblet Squat with Kettlebell  - 3 x weekly - 3 sets - 10 reps - Kettlebell Deadlift  - 3 x weekly - 3 sets - 10 reps  ASSESSMENT:  CLINICAL IMPRESSION: Patient arrives following pain exacerbation experienced directly after sitting watching TV on Sunday. Patient responded with additional prone press up reps and force progressions and was abolished after dry needling and manual therapy. Patient educated on seated positioning and how flexion while relaxing can bring back symptoms. She was encouraged to continue exercises for directional preference at home. Patient is continuing to improve with physical therapy, but has not yet returned to prior level of function. Patient would benefit from continued management of limiting condition by skilled physical therapist to address remaining impairments and functional limitations to work towards  stated goals and return to PLOF or maximal functional independence.   From initial PT evaluation 09/07/2022:  Patient is a 48 y.o. female who was seen today for physical therapy evaluation and treatment for low back pain and lumbar strain. Patient presents with moderate TTP with soft tissue restrictions/myofascial trigger points of right lumbar paraspinals, QL, glute med. Patient with poor core stability and demonstrates inability to squat and perform transfers. 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 Patient requires continued skilled PT services to address deficits and return to prior level of function.   OBJECTIVE IMPAIRMENTS: decreased mobility, difficulty walking, decreased ROM, decreased strength, and pain.   ACTIVITY LIMITATIONS: carrying, lifting, bending, sitting, squatting, stairs, transfers, and bed mobility  PARTICIPATION LIMITATIONS: cleaning, driving, occupation, and yard work  PERSONAL FACTORS: 1 comorbidity: Pain and lumbar strain  are also affecting patient's functional outcome.   REHAB POTENTIAL: Good  CLINICAL DECISION MAKING: Stable/uncomplicated  EVALUATION COMPLEXITY: Low   GOALS: Goals reviewed with patient? No  SHORT TERM GOALS: Target date: 08/05/2023    Patient will be independent with HEP to be able to improve carryover of sessions Baseline: Goal status: In-progress  2.  Patient ODI less than 10% to improve functional tolerance Baseline: ODI 20% (07/08/2023);  Goal status: In progress  3.  Patient will be able to demonstrate ability to perform sit to stand from chair without UE assist and no pain Baseline: Requires use of UE on arm rest to stand with staggered stance due to poor core stability (07/08/2023);  Goal status: In progress  4.  Patient will be able to ascend and descend stairs without UE assist Baseline: Mod assist and pain (07/08/2023);  Goal status: In progress   LONG TERM GOALS:  Target date: 09/02/2023    Patient will be able to squat and  lift at least 30lbs with proper mechanics to be able to perform patient transfers at work Baseline: Unable to lift without increase in pain (07/08/2023);  Goal status: In progress  2.  Patient will be able to walk greater than 1 hour without pain to be able to return to work Baseline: Pain with greater than 10 minutes (07/08/2023);  Goal status: in progress  3.  Patient will be able to perform house cleaning chores greater than 1 hour without pain Baseline: Unable to perform (07/08/2023);  Goal status: In progress   PLAN:  PT FREQUENCY: 2x/week  PT DURATION: 8 weeks  PLANNED INTERVENTIONS: 97164- PT Re-evaluation, 97110-Therapeutic exercises, 97530- Therapeutic activity, 97112- Neuromuscular re-education, 97535- Self Care, 02859- Manual therapy, [phone]- Gait training, [phone]- Orthotic Fit/training, [phone]- Aquatic Therapy, Patient/Family education, Balance training, Stair training, Taping, Dry Needling, Joint mobilization, Joint manipulation, Spinal manipulation, Spinal mobilization, Cryotherapy, and Moist heat.  PLAN FOR NEXT SESSION: update HEP as appropriate, utilize exercises for directional preference as appropriate, progressive core/LE/functional strengthening/motor-control/muscle endurance as tolerated, education, manual therapy as needed.    Camie SAUNDERS. Juli, PT, DPT 08/01/23, 8:25 PM  [facility] [address] [place], [place] [address] P: [phone] I F: [phone]

## 2023-08-01 ENCOUNTER — Encounter: Payer: Self-pay | Admitting: Physical Therapy

## 2023-08-15 ENCOUNTER — Ambulatory Visit: Payer: PRIVATE HEALTH INSURANCE | Admitting: Physical Therapy

## 2023-08-15 ENCOUNTER — Encounter: Payer: Self-pay | Admitting: Physical Therapy

## 2023-08-15 ENCOUNTER — Encounter: Payer: 59 | Admitting: Physical Therapy

## 2023-08-15 DIAGNOSIS — M5459 Other low back pain: Secondary | ICD-10-CM

## 2023-08-15 DIAGNOSIS — M6281 Muscle weakness (generalized): Secondary | ICD-10-CM

## 2023-08-15 NOTE — Therapy (Signed)
OUTPATIENT PHYSICAL THERAPY TREATMENT   Patient Name: Cathy Ponce MRN: [id number] DOB:11-23-1975, 48 y.o., female Today's Date: 08/15/2023  END OF SESSION:  PT End of Session - 08/15/23 1309     Visit Number 7    Number of Visits 17    Date for PT Re-Evaluation 09/09/23    Authorization Type [employer] reporting period from 07/08/2023    Authorization Time Period Case Mgr. Michelle [id number]) Auth. 12 visits as of 08/09/23    Authorization - Visit Number 1    Authorization - Number of Visits 12    Progress Note Due on Visit 10    PT Start Time 1303    PT Stop Time 1341    PT Time Calculation (min) 38 min    Activity Tolerance Patient tolerated treatment well    Behavior During Therapy WFL for tasks assessed/performed               Past Medical History:  Diagnosis Date   Diabetes mellitus without complication (HCC)    Hypercholesterolemia    Past Surgical History:  Procedure Laterality Date   DILATION AND CURETTAGE OF UTERUS  2017   Patient Active Problem List   Diagnosis Date Noted   Subacute maxillary sinusitis 07/08/2020   Cluster headache, not intractable 07/08/2020   Diabetes 1.5, managed as type 2 (HCC) 07/08/2020    PCP: [facility] [place]   REFERRING PROVIDER: Ivery Quale PA-C  REFERRING DIAG: Low back pain  Rationale for Evaluation and Treatment: Rehabilitation  THERAPY DIAG:  Other low back pain  Generalized muscle weakness  ONSET DATE: 05/27/2023  SUBJECTIVE:                                                                                                                                                                                           PERTINENT HISTORY:  Diabetes  SUBJECTIVE STATEMENT: Patient reports she has been doing well. She states she feels pain mostly in her right anterior thigh when she sits for a long time at work. She states her back hurts just a little after she tried picking up something 25#  this morning to see what it was like. She wants to go back to work next week. She did squats and deadlifts with the 25# this morning. She has been doing a lot of back stretching since last PT session and has not been practicing the squats and dead lifts. She wants to continue with squats and dead lifts at 25# though. She states the highest her pain has been is 3/10 in the last 2 weeks. She reports feeling good after  dry needling last visit.   Job description says she has to lift 40-50# but not in a specific range per pt memory.    PAIN:  Are you having pain?  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, be able to do house chores  NEXT MD VISIT: 07/22/2023  OBJECTIVE:   Asterisk sign (lumbar flexion): fingers to distal 4th of shin at start of visit, improvement to almost ankles by end of visit.   TODAY'S TREATMENT     Therapeutic exercise: to centralize symptoms and improve ROM, strength, muscular endurance, and activity tolerance required for successful completion of functional activities.  Treadmill 2.5 mph at 0% grade with no lasting UE support. For improved lower extremity mobility, muscular endurance, and weightbearing activity tolerance; and to induce the analgesic effect of aerobic exercise, stimulate improved joint nutrition, and prepare body structures and systems for following interventions. x 6  minutes.  Prone press up, 2x10 with hands on 4 inch yoga blocks using lock and sag technique  Pain resolved, able to reach to ankles in lumbar flexion  Pallof Press with 5# cable             1x10 each side 1x15 each side 1x20 each side  Hooklying curl up 1x20 seconds 1x30 seconds Education on engagement of deep core with ribs down and naval towards spine while still breathing  Hooklying curl up in table top position  4x30 seconds Attempted with alternating shoulder flexion,  but unable to maintain form for full amount of time.   Education on HEP including handout    Pt required multimodal cuing for proper technique and to facilitate improved neuromuscular control, strength, range of motion, and functional ability resulting in improved performance and form.    PATIENT EDUCATION:  Education details: Exercise purpose/form. Self management techniques.. Education on HEP.  Person educated: Patient Education method: Explanation, Demonstration, and Verbal cues Education comprehension: verbalized understanding, returned demonstration, and needs further education  HOME EXERCISE PROGRAM: Access Code: [email or web address] URL: [email or web address] Date: 08/15/2023 Prepared by: Norton Blizzard  Exercises - Supine Bridge  - 2 x daily - 7 x weekly - 2 sets - 15 reps - Prone Press Up  - 2 x daily - 7 x weekly - 10-15 reps - 1 second hold - Supine 90/90 Sciatic Nerve Glide with Knee Flexion/Extension  - 2 x daily - 7 x weekly - 15 reps - Standing Anti-Rotation Press with Anchored Resistance  - 2 x daily - 7 x weekly - 2 sets - 20 reps - 5 seconds hold - Seated Correct Posture  - Goblet Squat with Kettlebell  - 3 x weekly - 3 sets - 10 reps - Kettlebell Deadlift  - 3 x weekly - 3 sets - 10 reps - Supine 90/90 Shoulder Flexion with Abdominal Bracing  - 3-5 x weekly - 5 reps - 30 seconds hold  ASSESSMENT:  CLINICAL IMPRESSION: Patient returns to PT after just over 2 weeks away while waiting for work comp authorization. Patient appears very motivated to return to work and did 2x10 squats and deadlifts with 25# weight today prior to coming to PT. She did have slight increase in pain at central low back with these exercises, which resolved in clinic with prone press up. She was able to progress core strengthening exercises and pallof press (anti-rotation) exercises this visit with good tolerance. Plan  to continue working on improving activity tolerance, mechanics, and motor  control/strengthening to improve safety and tolerance to lifting to allow her to return to work successfully. Patient would benefit from continued management of limiting condition by skilled physical therapist to address remaining impairments and functional limitations to work towards stated goals and return to PLOF or maximal functional independence.   From initial PT evaluation 09/07/2022:  Patient is a 48 y.o. female who was seen today for physical therapy evaluation and treatment for low back pain and lumbar strain. Patient presents with moderate TTP with soft tissue restrictions/myofascial trigger points of right lumbar paraspinals, QL, glute med. Patient with poor core stability and demonstrates inability to squat and perform transfers. 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 Patient requires continued skilled PT services to address deficits and return to prior level of function.   OBJECTIVE IMPAIRMENTS: decreased mobility, difficulty walking, decreased ROM, decreased strength, and pain.   ACTIVITY LIMITATIONS: carrying, lifting, bending, sitting, squatting, stairs, transfers, and bed mobility  PARTICIPATION LIMITATIONS: cleaning, driving, occupation, and yard work  PERSONAL FACTORS: 1 comorbidity: Pain and lumbar strain  are also affecting patient's functional outcome.   REHAB POTENTIAL: Good  CLINICAL DECISION MAKING: Stable/uncomplicated  EVALUATION COMPLEXITY: Low   GOALS: Goals reviewed with patient? No  SHORT TERM GOALS: Target date: 08/05/2023    Patient will be independent with HEP to be able to improve carryover of sessions Baseline: Goal status: In-progress  2.  Patient ODI less than 10% to improve functional tolerance Baseline: ODI 20% (07/08/2023);  Goal status: In progress  3.  Patient will be able to demonstrate ability to perform sit to stand from chair without UE assist and no pain Baseline: Requires use  of UE on arm rest to stand with staggered stance due to poor core stability (07/08/2023);  Goal status: In progress  4.  Patient will be able to ascend and descend stairs without UE assist Baseline: Mod assist and pain (07/08/2023);  Goal status: In progress   LONG TERM GOALS: Target date: 09/02/2023    Patient will be able to squat and lift at least 30lbs with proper mechanics to be able to perform patient transfers at work Baseline: Unable to lift without increase in pain (07/08/2023);  Goal status: In progress  2.  Patient will be able to walk greater than 1 hour without pain to be able to return to work Baseline: Pain with greater than 10 minutes (07/08/2023);  Goal status: in progress  3.  Patient will be able to perform house cleaning chores greater than 1 hour without pain Baseline: Unable to perform (07/08/2023);  Goal status: In progress   PLAN:  PT FREQUENCY: 2x/week  PT DURATION: 8 weeks  PLANNED INTERVENTIONS: 97164- PT Re-evaluation, 97110-Therapeutic exercises, 97530- Therapeutic activity, 97112- Neuromuscular re-education, 97535- Self Care, 65784- Manual therapy, [phone]- Gait training, [phone]- Orthotic Fit/training, [phone]- Aquatic Therapy, Patient/Family education, Balance training, Stair training, Taping, Dry Needling, Joint mobilization, Joint manipulation, Spinal manipulation, Spinal mobilization, Cryotherapy, and Moist heat.  PLAN FOR NEXT SESSION: update HEP as appropriate, utilize exercises for directional preference as appropriate, progressive core/LE/functional strengthening/motor-control/muscle endurance as tolerated, education, manual therapy as needed.    Luretha Murphy. [name], PT, DPT 08/15/23, 2:09 PM  [facility] [address] [place], [place] [address] P: [phone] I F: [phone]

## 2023-08-17 ENCOUNTER — Ambulatory Visit: Payer: PRIVATE HEALTH INSURANCE | Admitting: Physical Therapy

## 2023-08-21 ENCOUNTER — Other Ambulatory Visit (HOSPITAL_COMMUNITY): Payer: Self-pay

## 2023-08-22 ENCOUNTER — Other Ambulatory Visit: Payer: Self-pay

## 2023-08-22 ENCOUNTER — Ambulatory Visit: Payer: PRIVATE HEALTH INSURANCE | Admitting: Physical Therapy

## 2023-08-23 ENCOUNTER — Ambulatory Visit
Admission: RE | Admit: 2023-08-23 | Discharge: 2023-08-23 | Disposition: A | Discharge status: Home / Self Care | Payer: 59 | Source: Ambulatory Visit | Attending: Family Medicine | Admitting: Family Medicine

## 2023-08-23 DIAGNOSIS — Z1231 Encounter for screening mammogram for malignant neoplasm of breast: Secondary | ICD-10-CM | POA: Diagnosis not present

## 2023-08-29 DIAGNOSIS — E119 Type 2 diabetes mellitus without complications: Secondary | ICD-10-CM | POA: Diagnosis not present

## 2023-08-29 DIAGNOSIS — B977 Papillomavirus as the cause of diseases classified elsewhere: Secondary | ICD-10-CM | POA: Diagnosis not present

## 2023-08-29 DIAGNOSIS — N871 Moderate cervical dysplasia: Secondary | ICD-10-CM | POA: Diagnosis not present

## 2023-08-29 DIAGNOSIS — R87628 Other abnormal cytological findings on specimens from vagina: Secondary | ICD-10-CM | POA: Diagnosis not present

## 2023-08-30 ENCOUNTER — Ambulatory Visit: Payer: PRIVATE HEALTH INSURANCE | Attending: Physician Assistant | Admitting: Physical Therapy

## 2023-08-30 ENCOUNTER — Encounter: Payer: Self-pay | Admitting: Physical Therapy

## 2023-08-30 DIAGNOSIS — M6281 Muscle weakness (generalized): Secondary | ICD-10-CM | POA: Insufficient documentation

## 2023-08-30 DIAGNOSIS — M5459 Other low back pain: Secondary | ICD-10-CM | POA: Insufficient documentation

## 2023-08-30 NOTE — Therapy (Signed)
 OUTPATIENT PHYSICAL THERAPY TREATMENT   Patient Name: Cathy Ponce MRN: [id number] DOB:[birth year], 48 y.o., female Today's Date: 08/30/2023  END OF SESSION:  PT End of Session - 08/30/23 1354     Visit Number 8    Number of Visits 17    Date for PT Re-Evaluation 09/09/23    Authorization Type [facility] reporting period from 07/08/2023    Authorization Time Period Case Mgr. Michelle [id number]) Auth. 12 visits as of 08/09/23    Authorization - Number of Visits 12    Progress Note Due on Visit 10    PT Start Time 1347    PT Stop Time 1438    PT Time Calculation (min) 51 min    Activity Tolerance Patient limited by pain    Behavior During Therapy [facility] for tasks assessed/performed             Past Medical History:  Diagnosis Date   Diabetes mellitus without complication (HCC)    Hypercholesterolemia    Past Surgical History:  Procedure Laterality Date   DILATION AND CURETTAGE OF UTERUS  2017   Patient Active Problem List   Diagnosis Date Noted   Subacute maxillary sinusitis 07/08/2020   Cluster headache, not intractable 07/08/2020   Diabetes 1.5, managed as type 2 (HCC) 07/08/2020    PCP: [facility] [place]   REFERRING PROVIDER: Ivery Quale PA-C  REFERRING DIAG: Low back pain  Rationale for Evaluation and Treatment: Rehabilitation  THERAPY DIAG:  Other low back pain  Generalized muscle weakness  ONSET DATE: 05/27/2023  SUBJECTIVE:                                                                                                                                                                                           PERTINENT HISTORY:  Diabetes  SUBJECTIVE STATEMENT: Patient states her back has good and bad days. Today is a good day. She has some pain now: back 1/10, leg 2/10. She had an injection in her back last week and she does not feel like it has helped much, but was told she would not get the full effect for 2 weeks. She has  had more leg pain since the shot. . She states she has not returned to work. She feels like her pain is normal and she is just going to have to live with it, since it does not seem to be leaving. She did not do any extension exercises last week since she could not tolerate laying on her back for 2 days due to pain after the shot. She spoke with the doctor about going back  to work last visit, and she was shocked that he did not let her return to work. She wants to try going back to work and feels her back will not hinder her. The doctor wanted to do the epidural shot and see if that works. He said if the epidural shot does not work, he wants to do surgery, but patient state she is not going to do surgery. The deviation/bulge is pressing on the right S1 nerve, and there is liquid there pressing on the nerve. The shot is supposed to dry the liquid up. She had some pain after her last PT session, more than when she came in.   Job description says she has to lift 40-50# but not in a specific range per pt memory.    PAIN:  Are you having pain?  NPRS: 1/10 in low back, 2/10 in low back.   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   Asterisk sign assessment throughout session: forwards flexion Start of session: Fingers to ankles After press ups/manual: unable to perform  Treadmill 2.5 mph at 0% grade with no lasting UE support. For improved lower extremity mobility, muscular endurance, and weightbearing activity tolerance; and to induce the analgesic effect of aerobic exercise, stimulate improved joint nutrition, and prepare body structures and systems for  following interventions. x 6  minutes.  Prone press up, with 4 inch yoga blocks under hands 1x10 with hands (Manual therapy - see below 2x10 using lock and sag technique (needed cuing)  Sat up and had spasms in lower back and pain down both legs, felt left leg was weak and could not stand.   Hooklying low trunk rotation, 1x2 min  Supine hamstring stretch 3x30 seconds each side  Slight neural pull in left so released tension periodically  Sat up again and was able to stand but continues to have left sided low back pain radiating into left leg with any forward bending at the hips or back.   Pt requested to ambulate again on TM but stopped at 3.5 mph with antalgic gait favoring L LE.  Patient educated on use of heat at home to help with likely muscle spasms.   Manual therapy: to reduce pain and tissue tension, improve range of motion, neuromodulation, in order to promote improved ability to complete functional activities. PRONE CPA, grade IV 1x30 seconds at S1, L3, L2 2x30 seconds at L5,  reproduces pain at right posterior thigh at first, minimal at 2nd set 3x30 seconds at L4 Reproduces pain at right posterior thigh at first, not on 3rd set  Pt required multimodal cuing for proper technique and to facilitate improved neuromuscular control, strength, range of motion, and functional ability resulting in improved performance and form.    PATIENT EDUCATION:  Education details: Exercise purpose/form. Self management techniques. Education on HEP.  Person educated: Patient Education method: Explanation, Demonstration, and Verbal cues Education comprehension: verbalized understanding, returned demonstration, and needs further education  HOME EXERCISE PROGRAM: Access Code: [email or web address] URL: [email or web address] Date: 08/15/2023 Prepared by: Norton Blizzard  Exercises - Supine Bridge  - 2 x daily - 7 x weekly - 2 sets - 15 reps -  Prone Press Up  - 2 x daily - 7 x weekly -  10-15 reps - 1 second hold - Supine 90/90 Sciatic Nerve Glide with Knee Flexion/Extension  - 2 x daily - 7 x weekly - 15 reps - Standing Anti-Rotation Press with Anchored Resistance  - 2 x daily - 7 x weekly - 2 sets - 20 reps - 5 seconds hold - Seated Correct Posture  - Goblet Squat with Kettlebell  - 3 x weekly - 3 sets - 10 reps - Kettlebell Deadlift  - 3 x weekly - 3 sets - 10 reps - Supine 90/90 Shoulder Flexion with Abdominal Bracing  - 3-5 x weekly - 5 reps - 30 seconds hold  ASSESSMENT:  CLINICAL IMPRESSION: Patient returns to PT just 2 weeks after last visit, reporting she has pain on and off and more leg pain after her injection that prevented her from doing her HEP for a week. Continues with exercises/manual therapy for extension preference based on positive previous responses. Patient reported CPAs during manual therapy felt good and like a good stretch to her back. She had some radiation down the right posterior thigh with CPAs that lessened with continues sets/reps and she was able to perform prone press ups better after manual. However, when she attempted to sit up following prone press ups and manual therapy, she had sudden increased pain in the left low back that radiated down her left leg that also felt weak. This was lessened by return to hooklying position with some gentle LTR and hamstring stretches (felt slight nerve pull on left). Following this, she was able to stand up with difficulty due to pain, but no longer felt weak. She found walking more comfortable and requested to use TM for 5 min but only completed 3.5 min due to increasing pain. This acute increase in left sided pain was unexpected response to previously successful interventions. Plan to monitor and adjust treatment interventions as appropriate next session. Patient would benefit from continued management of limiting condition by skilled physical therapist to address remaining impairments and functional limitations to  work towards stated goals and return to PLOF or maximal functional independence.   From initial PT evaluation 09/07/2022:  Patient is a 48 y.o. female who was seen today for physical therapy evaluation and treatment for low back pain and lumbar strain. Patient presents with moderate TTP with soft tissue restrictions/myofascial trigger points of right lumbar paraspinals, QL, glute med. Patient with poor core stability and demonstrates inability to squat and perform transfers. 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 Patient requires continued skilled PT services to address deficits and return to prior level of function.   OBJECTIVE IMPAIRMENTS: decreased mobility, difficulty walking, decreased ROM, decreased strength, and pain.   ACTIVITY LIMITATIONS: carrying, lifting, bending, sitting, squatting, stairs, transfers, and bed mobility  PARTICIPATION LIMITATIONS: cleaning, driving, occupation, and yard work  PERSONAL FACTORS: 1 comorbidity: Pain and lumbar strain  are also affecting patient's functional outcome.   REHAB POTENTIAL: Good  CLINICAL DECISION MAKING: Stable/uncomplicated  EVALUATION COMPLEXITY: Low   GOALS: Goals reviewed with patient? No  SHORT TERM GOALS: Target date: 08/05/2023    Patient will be independent with HEP to be able to improve carryover of sessions Baseline: Goal status: In-progress  2.  Patient ODI less than 10% to improve functional tolerance Baseline: ODI 20% (07/08/2023);  Goal status: In progress  3.  Patient will be able to demonstrate  ability to perform sit to stand from chair without UE assist and no pain Baseline: Requires use of UE on arm rest to stand with staggered stance due to poor core stability (07/08/2023);  Goal status: In progress  4.  Patient will be able to ascend and descend stairs without UE assist Baseline: Mod assist and pain (07/08/2023);  Goal status: In progress   LONG  TERM GOALS: Target date: 09/02/2023    Patient will be able to squat and lift at least 30lbs with proper mechanics to be able to perform patient transfers at work Baseline: Unable to lift without increase in pain (07/08/2023);  Goal status: In progress  2.  Patient will be able to walk greater than 1 hour without pain to be able to return to work Baseline: Pain with greater than 10 minutes (07/08/2023);  Goal status: in progress  3.  Patient will be able to perform house cleaning chores greater than 1 hour without pain Baseline: Unable to perform (07/08/2023);  Goal status: In progress   PLAN:  PT FREQUENCY: 2x/week  PT DURATION: 8 weeks  PLANNED INTERVENTIONS: 97164- PT Re-evaluation, 97110-Therapeutic exercises, 97530- Therapeutic activity, 97112- Neuromuscular re-education, 97535- Self Care, 16109- Manual therapy, [phone]- Gait training, [phone]- Orthotic Fit/training, [phone]- Aquatic Therapy, Patient/Family education, Balance training, Stair training, Taping, Dry Needling, Joint mobilization, Joint manipulation, Spinal manipulation, Spinal mobilization, Cryotherapy, and Moist heat.  PLAN FOR NEXT SESSION: update HEP as appropriate, utilize exercises for directional preference as appropriate, progressive core/LE/functional strengthening/motor-control/muscle endurance as tolerated, education, manual therapy as needed.    Luretha Murphy. [name], PT, DPT 08/30/23, 5:18 PM  [facility] [address] [place], [place] [address] P: [phone] I F: [phone]

## 2023-09-04 ENCOUNTER — Telehealth: Payer: Self-pay | Admitting: Physical Therapy

## 2023-09-04 NOTE — Telephone Encounter (Signed)
 Called patient to check on her after she left last PT visit with increased left sided back and leg pain. She says she put some bengay on it after leaving and when she woke up the next morning she was back to baseline.   Cathy Ponce. [name], PT, DPT 09/04/23, 5:08 PM  [facility] [address] [place], [place] [address] P: [phone] I F: [phone]

## 2023-09-06 ENCOUNTER — Ambulatory Visit: Payer: PRIVATE HEALTH INSURANCE | Admitting: Physical Therapy

## 2023-09-12 ENCOUNTER — Ambulatory Visit: Payer: PRIVATE HEALTH INSURANCE | Admitting: Physical Therapy

## 2023-09-18 ENCOUNTER — Encounter: Payer: Self-pay | Admitting: Physical Therapy

## 2023-09-18 ENCOUNTER — Ambulatory Visit: Payer: PRIVATE HEALTH INSURANCE | Attending: Physician Assistant | Admitting: Physical Therapy

## 2023-09-18 DIAGNOSIS — M6281 Muscle weakness (generalized): Secondary | ICD-10-CM | POA: Insufficient documentation

## 2023-09-18 DIAGNOSIS — M5459 Other low back pain: Secondary | ICD-10-CM | POA: Insufficient documentation

## 2023-09-18 NOTE — Therapy (Signed)
 OUTPATIENT PHYSICAL THERAPY TREATMENT / PROGRESS NOTE / RE-CERTIFICATION Dates of reporting from 07/08/2023 to 09/18/2023   Patient Name: Cathy Ponce MRN: [id number] DOB:[birth year], 48 y.o., female Today's Date: 09/18/2023  END OF SESSION:  PT End of Session - 09/18/23 1008     Visit Number 9    Number of Visits 17    Date for PT Re-Evaluation 12/11/23    Authorization Type [facility] reporting period from 07/08/2023    Authorization Time Period Case Mgr. Michelle [id number]) Auth. 12 visits as of 08/09/23    Authorization - Visit Number 2    Authorization - Number of Visits 12    Progress Note Due on Visit 10    PT Start Time 0946    PT Stop Time 1030    PT Time Calculation (min) 44 min    Activity Tolerance Patient tolerated treatment well    Behavior During Therapy [facility] for tasks assessed/performed              Past Medical History:  Diagnosis Date   Diabetes mellitus without complication (HCC)    Hypercholesterolemia    Past Surgical History:  Procedure Laterality Date   DILATION AND CURETTAGE OF UTERUS  2017   Patient Active Problem List   Diagnosis Date Noted   Subacute maxillary sinusitis 07/08/2020   Cluster headache, not intractable 07/08/2020   Diabetes 1.5, managed as type 2 (HCC) 07/08/2020    PCP: [facility] [place]   REFERRING PROVIDER: Ivery Quale PA-C  REFERRING DIAG: Low back pain  Rationale for Evaluation and Treatment: Rehabilitation  THERAPY DIAG:  Other low back pain  Generalized muscle weakness  ONSET DATE: 05/27/2023  SUBJECTIVE:                                                                                                                                                                                           PERTINENT HISTORY:  Diabetes  SUBJECTIVE STATEMENT: Patient states she has about 80% improvement since her last injection on 09/12/2023. She is not having pain right now. She last had pain this  morning when she laid down on the couch. The pain was in her mid lower back. She only has pain now when she sits in the big couch, and she has pain down her anterior R thigh and in her back. She has not been allowed to go back to work yet. Her doctor was saying he wants her to get up to 80% better. He gave her work restrictions and her job was not able to accommodate it. The work restrictions included on bending, stooping, or lifting  more than 10#, wear a back brace, and ice the back. She has not done as much exercises after the shot except stretching. She cannot walk more than 5 min without feeling dull pain in her low back and right anterior thigh and she has not been walking much. She thinks she can do home cleaning chores for about 10 min without pain.   Job description says she has to lift 40-50# but not in a specific range per pt memory.   Per Dr. Shelle Iron Note on 09/05/2023: At this point I would recommend Light duty to consist of no lifting over 10 pounds, no prolonged sitting or standing over an hour at a time without the ability to change positions, no repetitive or unsupported bending.  No climbing. Occasional squatting only.    PAIN:  Are you having pain?  NPRS: 0/10 in low back, 0/10 in low back.   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, be able to do house chores  NEXT MD VISIT: 09/26/2023  OBJECTIVE:       TODAY'S TREATMENT     Therapeutic exercise: to centralize symptoms and improve ROM, strength, muscular endurance, and activity tolerance required for successful completion of functional activities.   Treadmill 2.5 mph at 0% grade with no lasting UE support. For improved lower extremity mobility, muscular endurance, and weightbearing activity tolerance; and to induce the analgesic effect of aerobic exercise, stimulate improved joint nutrition, and prepare body structures and systems for  following interventions. x 6  minutes.  Prone press up, with 4 inch yoga blocks under hands 2x10 using lock and sag technique  Improved pain after each set, pain abolished  Circuit:  Pilaties 100 at table top 2x100 beats Lateral plank from knees to elbow 2x30 seconds each side Bird dog 1x10 each side with 5 second holds 1x5 each side with 10 second holds Bridge with toes up 2x10 with 10 second holds.   Education on HEP including handout   Pt required multimodal cuing for proper technique and to facilitate improved neuromuscular control, strength, range of motion, and functional ability resulting in improved performance and form.    PATIENT EDUCATION:  Education details: Exercise purpose/form. Self management techniques. Education on HEP.  Person educated: Patient Education method: Explanation, Demonstration, and Verbal cues Education comprehension: verbalized understanding, returned demonstration, and needs further education  HOME EXERCISE PROGRAM: Access Code: [email or web address] URL: [email or web address] Date: 09/18/2023 Prepared by: Norton Blizzard  Exercises - Prone Press Up  - 7 x weekly - 10-15 reps - 1 second hold - every 2 hours while awake frequencey - The Hundred 3 Intermediate - Table Top  - 1 x daily - 5 x weekly - 2 sets - 1 reps - 100 beats hold - Side Plank on Knees  - 1 x daily - 5 x weekly - 2 sets - 1 reps - 30 seconds hold - Bird Dog  - 1 x daily - 5 x weekly - 2 sets - 5 reps - 10 second hold - Bridge on Heels  - 1 x daily - 5 x weekly - 2 sets - 10 reps - 10 seconds hold - Seated Correct Posture   ASSESSMENT:  CLINICAL IMPRESSION: Patient has attended 10 physical therapy sessions since starting current episode of care on 07/07/2024. She attended PT 2 times in the last 30 days related to scheduling difficulties. Her PT has also been delayed  before related to waiting for authorization. She has met all of her short term goals and is making progress  towards 2/3 of her short term goals. She continues to respond well with reduction of pain with repeated lumbar extension in prone. However, progression of force in this direction to CPAs caused increased pain and symptoms down the left leg, so it was discontinued. She also got some temporary relief from dry needling early in her rehab but this was not continued as the improvement was not lasting. She has not yet been able to keep up with extension exercises at home with the recommended frequency, and performing them every 2 hours was emphasized today. She had started to progress to lifting progression but then continued to have pain so this was regressed today to more neutral lumbar stabilization exercises, which she tolerated well, reporting decreased pain by end of session compared to when she arrived. Plan to continue with PT 2x a week until she is able to return to work or another treatment plan is more advisable. Patient would benefit from continued management of limiting condition by skilled physical therapist to address remaining impairments and functional limitations to work towards stated goals and return to PLOF or maximal functional independence.   From initial PT evaluation 09/07/2022:  Patient is a 48 y.o. female who was seen today for physical therapy evaluation and treatment for low back pain and lumbar strain. Patient presents with moderate TTP with soft tissue restrictions/myofascial trigger points of right lumbar paraspinals, QL, glute med. Patient with poor core stability and demonstrates inability to squat and perform transfers. 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 Patient requires continued skilled PT services to address deficits and return to prior level of function.   OBJECTIVE IMPAIRMENTS: decreased mobility, difficulty walking, decreased ROM, decreased strength, and pain.   ACTIVITY LIMITATIONS: carrying, lifting, bending,  sitting, squatting, stairs, transfers, and bed mobility  PARTICIPATION LIMITATIONS: cleaning, driving, occupation, and yard work  PERSONAL FACTORS: 1 comorbidity: Pain and lumbar strain  are also affecting patient's functional outcome.   REHAB POTENTIAL: Good  CLINICAL DECISION MAKING: Stable/uncomplicated  EVALUATION COMPLEXITY: Low   GOALS: Goals reviewed with patient? No  SHORT TERM GOALS: Target date: 08/05/2023    Patient will be independent with HEP to be able to improve carryover of sessions Baseline: Goal status: MET  2.  Patient ODI less than 10% to improve functional tolerance Baseline: ODI 20% (07/08/2023); ODI 24.4% (09/18/2023);  Goal status: Ongoing  3.  Patient will be able to demonstrate ability to perform sit to stand from chair without UE assist and no pain Baseline: Requires use of UE on arm rest to stand with staggered stance due to poor core stability (07/08/2023); able to complete with lingering pain from walking (09/18/2023);  Goal status: MET  4.  Patient will be able to ascend and descend stairs without UE assist Baseline: Mod assist and pain (07/08/2023); able to perform without extra pain (09/18/2023);  Goal status: MET   LONG TERM GOALS: Target date: 09/02/2023. UPDATED to 12/11/2023 for all unmet goals on 09/18/2023.     Patient will be able to squat and lift at least 30lbs with proper mechanics to be able to perform patient transfers at work Baseline: Unable to lift without increase in pain (07/08/2023); deferred due to MD recommendation of 10# lifting limit and pain with walking today (09/18/2023);  Goal status: In progress  2.  Patient will  be able to walk greater than 1 hour without pain to be able to return to work Baseline: Pain with greater than 10 minutes (07/08/2023); feels back and R anterior thigh pain with 5 min walk (09/18/2023);  Goal status: in progress  3.  Patient will be able to perform house cleaning chores greater than 1 hour without  pain Baseline: Unable to perform (07/08/2023); can do 10 min without pain (09/18/2023);  Goal status: In progress   PLAN:  PT FREQUENCY: 2x/week  PT DURATION: 8-12 weeks  PLANNED INTERVENTIONS: 97164- PT Re-evaluation, 97110-Therapeutic exercises, 97530- Therapeutic activity, 97112- Neuromuscular re-education, 97535- Self Care, 40981- Manual therapy, [phone]- Gait training, [phone]- Orthotic Fit/training, [phone]- Aquatic Therapy, Patient/Family education, Balance training, Stair training, Taping, Dry Needling, Joint mobilization, Joint manipulation, Spinal manipulation, Spinal mobilization, Cryotherapy, and Moist heat.  PLAN FOR NEXT SESSION: update HEP as appropriate, utilize exercises for directional preference as appropriate, progressive core/LE/functional strengthening/motor-control/muscle endurance as tolerated, education, manual therapy as needed.    Luretha Murphy. [name], PT, DPT 09/18/23, 10:46 AM  [facility] [address] [place], [place] [address] P: [phone] I F: [phone]

## 2023-09-19 ENCOUNTER — Other Ambulatory Visit (HOSPITAL_COMMUNITY): Payer: Self-pay

## 2023-09-19 MED ORDER — TRULICITY 1.5 MG/0.5ML ~~LOC~~ SOAJ
1.5000 mg | SUBCUTANEOUS | 3 refills | Status: AC
  Filled 2023-09-19: qty 6, 84d supply, fill #0
  Filled 2023-12-13 – 2024-08-04 (×2): qty 6, 84d supply, fill #1

## 2023-09-20 ENCOUNTER — Ambulatory Visit: Payer: PRIVATE HEALTH INSURANCE | Admitting: Physical Therapy

## 2023-09-20 ENCOUNTER — Other Ambulatory Visit: Payer: Self-pay

## 2023-09-25 ENCOUNTER — Ambulatory Visit: Payer: PRIVATE HEALTH INSURANCE | Admitting: Physical Therapy

## 2023-09-27 ENCOUNTER — Ambulatory Visit: Payer: PRIVATE HEALTH INSURANCE | Admitting: Physical Therapy

## 2023-09-27 ENCOUNTER — Telehealth: Payer: Self-pay

## 2023-09-27 NOTE — Telephone Encounter (Signed)
 Pt did not arrive for scheduled appointment. No telephone call nor message preceeded this absence. Author attempted to contact pt via telephone number listed in chart. Call went to voicemail, then reportedly had a full mailbox, unable to receive new messages.   7:19 PM, 09/27/23 Rosamaria Lints, PT, DPT Physical Therapist - [place] [phone] (Office)

## 2023-10-02 ENCOUNTER — Ambulatory Visit: Payer: PRIVATE HEALTH INSURANCE | Admitting: Physical Therapy

## 2023-10-02 ENCOUNTER — Encounter: Payer: Self-pay | Admitting: Physical Therapy

## 2023-10-02 DIAGNOSIS — M5459 Other low back pain: Secondary | ICD-10-CM

## 2023-10-02 DIAGNOSIS — M6281 Muscle weakness (generalized): Secondary | ICD-10-CM

## 2023-10-02 NOTE — Therapy (Unsigned)
 OUTPATIENT PHYSICAL THERAPY TREATMENT / PROGRESS NOTE  Dates of reporting from 07/08/2023 to 10/02/2023   Patient Name: Cathy Ponce MRN: [id number] DOB:22-Apr-1976, 48 y.o., female Today's Date: 10/02/2023  END OF SESSION:  PT End of Session - 10/02/23 1420     Visit Number 10    Number of Visits 17    Date for PT Re-Evaluation 12/11/23    Authorization Type [facility] reporting period from 07/08/2023    Authorization Time Period Case Mgr. Michelle [id number]) Auth. 12 visits as of 08/09/23    Authorization - Visit Number 3    Authorization - Number of Visits 12    Progress Note Due on Visit 10    PT Start Time 1350    PT Stop Time 1436    PT Time Calculation (min) 46 min    Activity Tolerance Patient tolerated treatment well;No increased pain    Behavior During Therapy [facility] for tasks assessed/performed               Past Medical History:  Diagnosis Date   Diabetes mellitus without complication (HCC)    Hypercholesterolemia    Past Surgical History:  Procedure Laterality Date   DILATION AND CURETTAGE OF UTERUS  2017   Patient Active Problem List   Diagnosis Date Noted   Subacute maxillary sinusitis 07/08/2020   Cluster headache, not intractable 07/08/2020   Diabetes 1.5, managed as type 2 (HCC) 07/08/2020    [facility]: [facility] [place]   REFERRING PROVIDER: Ivery Quale PA-C  REFERRING DIAG: Low back pain  Rationale for Evaluation and Treatment: Rehabilitation  THERAPY DIAG:  Other low back pain  Generalized muscle weakness  ONSET DATE: 05/27/2023  SUBJECTIVE:                                                                                                                                                                                           PERTINENT HISTORY:  Diabetes  SUBJECTIVE STATEMENT: Patient states she is feeling better. She thinks the doctor sent over a paper with suggested exercises on it (not in chart). She had a  paper, but forgot it in her husband's truck (he is a Naval architect and gets back today). She states she rarely has low back pain (85% better), but the pain in the anterior thigh is still there. She had a right trochanteric hip injection.  She was told she has bursitis in her lateral hip. She is starting back to work today. She states the right thigh pain is a little better since that injection. She states her HEP has been going well. She thinks she  dropped a pants size since she started PT because all of her pants are too big now.   Job description says she has to lift 40-50# but not in a specific range per pt memory.   Per Dr. Shelle Iron Note on 09/05/2023: At this point I would recommend Light duty to consist of no lifting over 10 pounds, no prolonged sitting or standing over an hour at a time without the ability to change positions, no repetitive or unsupported bending.  No climbing. Occasional squatting only.    PAIN:  Are you having pain?  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   TODAY'S TREATMENT     Therapeutic exercise: to centralize symptoms and improve ROM, strength, muscular endurance, and activity tolerance required for successful completion of functional activities.   Treadmill 2.5 mph at 0% grade with no lasting UE support. For improved lower extremity mobility, muscular endurance, and weightbearing activity tolerance; and to induce the analgesic effect of aerobic exercise, stimulate improved joint nutrition, and prepare body  structures and systems for following interventions. x 6  minutes.  Exam of right hip (see above)  Standing hip hikes on 2x4 inch board with U UE support 2x20 each side Added to HEP with education and handout  Standing lumbar extension with pelvis against high plinth (instead of prone press up due to R shoulder irritation today).  2x10 Feels good  Circuit:  Pilaties 100 at table top 2x50 beats  Attempted with knees extended but pt admitted to not doing this at home because she forgot and could not hold it at table top for more than 50 beats at a time.  Lateral banana hold (instead of plank due to R shoulder pain) 1x30 seconds each side Bird dog 1x5 each side with 10 second holds Bridge with toes up 1x10 with 10 second holds.   Education on HEP including handout   Pt required multimodal cuing for proper technique and to facilitate improved neuromuscular control, strength, range of motion, and functional ability resulting in improved performance and form.    PATIENT EDUCATION:  Education details: Exercise purpose/form. Self management techniques. Education on HEP.  Person educated: Patient Education method: Explanation, Demonstration, and Verbal cues Education comprehension: verbalized understanding, returned demonstration, and needs further education  HOME EXERCISE PROGRAM: Access Code: [email or web address] URL: [email or web address] Date: 10/02/2023 Prepared by: Norton Blizzard  Exercises - Standing Lumbar Extension with Counter  - 4 x daily - 1 sets - 10-15 reps - 1 second hold - Hip Hikes off step  - 1 x daily - 2-3 sets - 20 reps - The Hundred 3 Intermediate - Table Top  - 1 x daily - 5 x weekly - 2 sets - 1 reps - 100 beats hold - Sidelying Double Leg Lift  - 5 x weekly - 2 sets - 30 seconds hold - Bird Dog  - 1 x daily - 5 x weekly - 2 sets - 5 reps - 10 second hold - Bridge on Heels  - 1 x daily -  5 x weekly - 2 sets - 10 reps - 10 seconds  hold  ASSESSMENT:  CLINICAL IMPRESSION: Felt no pain by end of session (resolved after lumbar extensions).   Patient has attended 10 physical therapy sessions since starting current episode of care on 07/07/2024. She attended PT 3 times in the last 7 weeks related to scheduling difficulties. Her PT has also been delayed before related to waiting for authorization. She has met 3/4 of her short term goals (no progress towards mODI) and is making progress towards 2/3 of her short term goals. She continues to respond well with reduction of pain with repeated lumbar extension in prone. Today she was negative for hip derotation test but was TTP near R greater trochanter. Her pain in her leg resolved with repeated lumbar extension in standing.  Her HEP was adjusted to be standing lumbar extension due to the improved convenience of completing the exercise and to keep from irritating her shoulder. Also continued to work on core stabilization and added slow loading for the glute med that is nearly always involved in greater trochanteric pain syndrome. Patient is planing to return to work soon. Plan to continue with PT as approved by insurance until patient has fully recovered function.  Patient would benefit from continued management of limiting condition by skilled physical therapist to address remaining impairments and functional limitations to work towards stated goals and return to PLOF or maximal functional independence.   From initial PT evaluation 09/07/2022:  Patient is a 48 y.o. female who was seen today for physical therapy evaluation and treatment for low back pain and lumbar strain. Patient presents with moderate TTP with soft tissue restrictions/myofascial trigger points of right lumbar paraspinals, QL, glute med. Patient with poor core stability and demonstrates inability to squat and perform transfers. 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  Patient requires continued skilled PT services to address deficits and return to prior level of function.   OBJECTIVE IMPAIRMENTS: decreased mobility, difficulty walking, decreased ROM, decreased strength, and pain.   ACTIVITY LIMITATIONS: carrying, lifting, bending, sitting, squatting, stairs, transfers, and bed mobility  PARTICIPATION LIMITATIONS: cleaning, driving, occupation, and yard work  PERSONAL FACTORS: 1 comorbidity: Pain and lumbar strain  are also affecting patient's functional outcome.   REHAB POTENTIAL: Good  CLINICAL DECISION MAKING: Stable/uncomplicated  EVALUATION COMPLEXITY: Low   GOALS: Goals reviewed with patient? No  SHORT TERM GOALS: Target date: 08/05/2023    Patient will be independent with HEP to be able to improve carryover of sessions Baseline: Goal status: MET  2.  Patient ODI less than 10% to improve functional tolerance Baseline: ODI 20% (07/08/2023); ODI 24.4% (09/18/2023); 20% (10/02/2023);  Goal status: Ongoing  3.  Patient will be able to demonstrate ability to perform sit to stand from chair without UE assist and no pain Baseline: Requires use of UE on arm rest to stand with staggered stance due to poor core stability (07/08/2023); able to complete with lingering pain from walking (09/18/2023);  Goal status: MET  4.  Patient will be able to ascend and descend stairs without UE assist Baseline: Mod assist and pain (07/08/2023); able to perform without extra pain (09/18/2023);  Goal status: MET   LONG TERM GOALS: Target date: 09/02/2023. UPDATED to 12/11/2023 for all unmet goals on 09/18/2023.     Patient will be able to squat and lift at least 30lbs with proper mechanics to be able to perform patient  transfers at work Baseline: Unable to lift without increase in pain (07/08/2023); deferred due to MD recommendation of 10# lifting limit and pain with walking today (09/18/2023);  Goal status: In progress  2.  Patient will be able to walk greater than  1 hour without pain to be able to return to work Baseline: Pain with greater than 10 minutes (07/08/2023); feels back and R anterior thigh pain with 5 min walk (09/18/2023); 10 min without pain (10/02/2023);  Goal status: in progress  3.  Patient will be able to perform house cleaning chores greater than 1 hour without pain Baseline: Unable to perform (07/08/2023); can do 10 min without pain (09/18/2023); can do 5-10 min without pain (10/02/2023);  Goal status: In progress   PLAN:  PT FREQUENCY: 2x/week  PT DURATION: 8-12 weeks  PLANNED INTERVENTIONS: 97164- PT Re-evaluation, 97110-Therapeutic exercises, 97530- Therapeutic activity, 97112- Neuromuscular re-education, 97535- Self Care, 16109- Manual therapy, [phone]- Gait training, [phone]- Orthotic Fit/training, [phone]- Aquatic Therapy, Patient/Family education, Balance training, Stair training, Taping, Dry Needling, Joint mobilization, Joint manipulation, Spinal manipulation, Spinal mobilization, Cryotherapy, and Moist heat.  PLAN FOR NEXT SESSION: update HEP as appropriate, utilize exercises for directional preference as appropriate, progressive core/LE/functional strengthening/motor-control/muscle endurance as tolerated, education, manual therapy as needed.    Luretha Murphy. [name], PT, DPT 10/02/23, 9:29 PM  [facility] [address] [place], [place] [address] P: [phone] I F: [phone]

## 2023-10-04 ENCOUNTER — Other Ambulatory Visit (HOSPITAL_COMMUNITY): Payer: Self-pay

## 2023-10-04 ENCOUNTER — Other Ambulatory Visit: Payer: Self-pay

## 2023-10-04 DIAGNOSIS — Z1211 Encounter for screening for malignant neoplasm of colon: Secondary | ICD-10-CM | POA: Diagnosis not present

## 2023-10-04 MED ORDER — NA SULFATE-K SULFATE-MG SULF 17.5-3.13-1.6 GM/177ML PO SOLN
ORAL | 0 refills | Status: AC
  Filled 2023-10-04: qty 354, 1d supply, fill #0

## 2023-10-05 ENCOUNTER — Ambulatory Visit: Payer: PRIVATE HEALTH INSURANCE | Admitting: Physical Therapy

## 2023-10-09 ENCOUNTER — Encounter: Payer: Self-pay | Admitting: Physical Therapy

## 2023-10-10 ENCOUNTER — Other Ambulatory Visit (HOSPITAL_COMMUNITY): Payer: Self-pay

## 2023-10-10 ENCOUNTER — Encounter: Payer: Self-pay | Admitting: Physical Therapy

## 2023-10-10 ENCOUNTER — Ambulatory Visit: Payer: PRIVATE HEALTH INSURANCE | Admitting: Physical Therapy

## 2023-10-10 DIAGNOSIS — M5459 Other low back pain: Secondary | ICD-10-CM | POA: Diagnosis not present

## 2023-10-10 DIAGNOSIS — M6281 Muscle weakness (generalized): Secondary | ICD-10-CM

## 2023-10-10 MED ORDER — MELOXICAM 15 MG PO TABS
15.0000 mg | ORAL_TABLET | Freq: Every day | ORAL | 1 refills | Status: AC | PRN
  Filled 2023-10-10: qty 30, 30d supply, fill #0
  Filled 2023-12-13: qty 30, 30d supply, fill #1

## 2023-10-10 MED ORDER — METHOCARBAMOL 500 MG PO TABS
500.0000 mg | ORAL_TABLET | Freq: Every evening | ORAL | 1 refills | Status: AC | PRN
  Filled 2023-10-10: qty 30, 30d supply, fill #0
  Filled 2024-08-04: qty 30, 30d supply, fill #1

## 2023-10-10 MED ORDER — PREDNISONE 5 MG (21) PO TBPK
ORAL_TABLET | ORAL | 1 refills | Status: AC
  Filled 2023-10-10: qty 21, 6d supply, fill #0
  Filled 2023-12-13: qty 21, 6d supply, fill #1

## 2023-10-10 NOTE — Therapy (Signed)
 OUTPATIENT PHYSICAL THERAPY TREATMENT   Patient Name: Cathy Ponce MRN: [id number] DOB:13-May-1976, 48 y.o., female Today's Date: 10/10/2023  END OF SESSION:  PT End of Session - 10/10/23 1601     Visit Number 11    Number of Visits 17    Date for PT Re-Evaluation 12/11/23    Authorization Type [employer] reporting period from 07/08/2023    Authorization Time Period Case Mgr. Michelle [phone]) Auth. 12 visits as of 08/09/23    Authorization - Visit Number 4    Authorization - Number of Visits 12    Progress Note Due on Visit 10    PT Start Time 1516    PT Stop Time 1600    PT Time Calculation (min) 44 min    Activity Tolerance Patient tolerated treatment well;No increased pain    Behavior During Therapy [facility] for tasks assessed/performed                Past Medical History:  Diagnosis Date   Diabetes mellitus without complication (HCC)    Hypercholesterolemia    Past Surgical History:  Procedure Laterality Date   DILATION AND CURETTAGE OF UTERUS  2017   Patient Active Problem List   Diagnosis Date Noted   Subacute maxillary sinusitis 07/08/2020   Cluster headache, not intractable 07/08/2020   Diabetes 1.5, managed as type 2 (HCC) 07/08/2020    PCP: [facility] [place]   REFERRING PROVIDER: Ivery Quale PA-C  REFERRING DIAG: Low back pain  Rationale for Evaluation and Treatment: Rehabilitation  THERAPY DIAG:  Other low back pain  Generalized muscle weakness  ONSET DATE: 05/27/2023  SUBJECTIVE:                                                                                                                                                                                           PERTINENT HISTORY:  Diabetes  SUBJECTIVE STATEMENT: Patient states she has been doing well. She brought in a paper from her doctor that is an IT Band Syndrome Protocol from [facility] and states he told her to stop doing all  the other exercises and do these. This is the paper that she had left in her husbands truck, but when she saw the doctor this morning he went over it again with her. He also went over how to modify her movements as needed at work. There are some exercises she does not know how to do from the paper. There are >15 exercises on the paper and no frequency guidance, but most exercises has sets and reps recommendations.  She states her pain has  been lower. She was released for full duty this morning. As long a she remembers to shift her weight to her left when sitting a long time she is okay. She tries not to sit for a long time.   Job description says she has to lift 40-50# but not in a specific range per pt memory.   Per Dr. Shelle Iron Note on 09/05/2023: At this point I would recommend Light duty to consist of no lifting over 10 pounds, no prolonged sitting or standing over an hour at a time without the ability to change positions, no repetitive or unsupported bending.  No climbing. Occasional squatting only.    PAIN:  Are you having pain?  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, be able to do house chores  NEXT MD VISIT: 09/26/2023  OBJECTIVE:    TODAY'S TREATMENT     Therapeutic exercise: to centralize symptoms and improve ROM, strength, muscular endurance, and activity tolerance required for successful completion of functional activities.   Treadmill 2.5 mph at 0% grade with no lasting UE support. For improved lower extremity mobility, muscular endurance, and weightbearing activity tolerance; and to induce the analgesic effect of aerobic exercise, stimulate improved joint nutrition, and prepare body structures and systems for following interventions. x 6  minutes.  Review of handout from doctor with trial of one set of all exercises that felt okay Standing R IT band  stretch, 1x20 seconds (discontinued when it caused increased pain).   Hooklying/lying R active SLR flexion, abduction, extension  1x15 each direction (skipped adduction due to potentia for pain)  R hip abduction with foot externally rotated 1x15  Sidelying Fire hydrant position 1x20 horizontal hip abduction/ER from 90 degrees flexion to ER abduction with knee flexed to 90.   Sidelying hip circles 1x20 each direction  Did not complete exercises that included excessive hip adduction after trial of IT band stretch.   Standing lumbar extension with pelvis against high plinth (instead of prone press up due to R shoulder irritation today).  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    PATIENT EDUCATION:  Education details: Exercise purpose/form. Self management techniques. Education on HEP.  Person educated: Patient Education method: Explanation, Demonstration, and Verbal cues Education comprehension: verbalized understanding, returned demonstration, and needs further education  HOME EXERCISE PROGRAM: Access Code: [email or web address] URL: [email or web address] Date: 10/02/2023 Prepared by: Norton Blizzard  Exercises - Standing Lumbar Extension with Counter  - 4 x daily - 1 sets - 10-15 reps - 1 second hold - Hip Hikes off step  - 1 x daily - 2-3 sets - 20 reps - The Hundred 3 Intermediate - Table Top  - 1 x daily - 5 x weekly - 2 sets - 1 reps - 100 beats hold - Sidelying Double Leg Lift  - 5  x weekly - 2 sets -  30 seconds hold - Bird Dog  - 1 x daily - 5 x weekly - 2 sets - 5 reps - 10 second hold - Bridge on Heels  - 1 x daily - 5 x weekly - 2 sets - 10 reps - 10 seconds hold  ASSESSMENT:  CLINICAL IMPRESSION: Patient arrives with improved R leg pain and handout for IT Band Syndrome from Dr. Shelle Iron who is releasing her back to full duty. Patient reported Dr. Shelle Iron asked her to stop doing the exercises PT had given her and start doing the exercises on the handout, which was a protocol broken into 3 sections. Patient stated she was not having any trouble with the first section of exercises which were focused on stretching, except for two that required hip adduction that she wanted help with. These two stretches were reviewed by PT who recommended discontinuation of them due to likely irritation of condition from compression at the greater trochanter with hip adduction after patient experienced lingering worsening symptoms when attempting the standing ITB stretch. PT then reviewed exercises in the 2nd section and patient practiced all of them except those that would compress the greater trochanter with hip adduction. She was able to perform them with good to acceptable form with cuing and was quite fatigued from them so only one set performed today. PT educated patient on third section of protocol which was for when the 2nd section was successfully tolerated. Following this PT reviewed lumbar extension exercise which patient performed in standing 2x10 which completely alleviated her hip pain that had been lingering since the ITB stretch. PT notes this consistently alleviates patient's pain and recommended she continue this exercise as it effectively addresses her pain complaints. Also continued with hip hikes for glute med strengthening and abdominal strengthening (to help patient with active support of her back during work activities) without any further complaints of pain. Patient's pain resolved  by end of session an she "feels like a million bucks." Patient did need re-teaching of all exercises repeated form HEP today (except standing lumbar extension), as she performed them incorrectly. She also continues to report not doing lumbar extension exercise throughout the day, despite improvement in symptoms with this exercise during PT sessions and education from PT on the importance of performing this exercise regularly and including when asymptomatic for it to be effective. Plan to continue reinforcing how to effectively use this exercise. Plan to continue with exercises for hips and core, progressing to functional exercises as appropriate. Patient would benefit from continued management of limiting condition by skilled physical therapist to address remaining impairments and functional limitations to work towards stated goals and return to PLOF or maximal functional independence.   From initial PT evaluation 09/07/2022:  Patient is a 48 y.o. female who was seen today for physical therapy evaluation and treatment for low back pain and lumbar strain. Patient presents with moderate TTP with soft tissue restrictions/myofascial trigger points of right lumbar paraspinals, QL, glute med. Patient with poor core stability and demonstrates inability to squat and perform transfers. 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 Patient requires continued skilled PT services to address deficits and return to prior level of function.   OBJECTIVE IMPAIRMENTS: decreased mobility, difficulty walking, decreased ROM, decreased strength, and pain.   ACTIVITY LIMITATIONS: carrying, lifting, bending, sitting, squatting, stairs, transfers, and bed mobility  PARTICIPATION LIMITATIONS: cleaning, driving, occupation, and yard work  PERSONAL FACTORS: 1 comorbidity: Pain and  lumbar strain  are also affecting patient's functional outcome.   REHAB POTENTIAL: Good  CLINICAL  DECISION MAKING: Stable/uncomplicated  EVALUATION COMPLEXITY: Low   GOALS: Goals reviewed with patient? No  SHORT TERM GOALS: Target date: 08/05/2023    Patient will be independent with HEP to be able to improve carryover of sessions Baseline: Goal status: MET  2.  Patient ODI less than 10% to improve functional tolerance Baseline: ODI 20% (07/08/2023); ODI 24.4% (09/18/2023); 20% (10/02/2023);  Goal status: Ongoing  3.  Patient will be able to demonstrate ability to perform sit to stand from chair without UE assist and no pain Baseline: Requires use of UE on arm rest to stand with staggered stance due to poor core stability (07/08/2023); able to complete with lingering pain from walking (09/18/2023);  Goal status: MET  4.  Patient will be able to ascend and descend stairs without UE assist Baseline: Mod assist and pain (07/08/2023); able to perform without extra pain (09/18/2023);  Goal status: MET   LONG TERM GOALS: Target date: 09/02/2023. UPDATED to 12/11/2023 for all unmet goals on 09/18/2023.     Patient will be able to squat and lift at least 30lbs with proper mechanics to be able to perform patient transfers at work Baseline: Unable to lift without increase in pain (07/08/2023); deferred due to MD recommendation of 10# lifting limit and pain with walking today (09/18/2023);  Goal status: In progress  2.  Patient will be able to walk greater than 1 hour without pain to be able to return to work Baseline: Pain with greater than 10 minutes (07/08/2023); feels back and R anterior thigh pain with 5 min walk (09/18/2023); 10 min without pain (10/02/2023);  Goal status: in progress  3.  Patient will be able to perform house cleaning chores greater than 1 hour without pain Baseline: Unable to perform (07/08/2023); can do 10 min without pain (09/18/2023); can do 5-10 min without pain (10/02/2023);  Goal status: In progress   PLAN:  PT FREQUENCY: 2x/week  PT DURATION: 8-12  weeks  PLANNED INTERVENTIONS: 97164- PT Re-evaluation, 97110-Therapeutic exercises, 97530- Therapeutic activity, 97112- Neuromuscular re-education, 97535- Self Care, 83151- Manual therapy, [phone]- Gait training, [phone]- Orthotic Fit/training, [phone]- Aquatic Therapy, Patient/Family education, Balance training, Stair training, Taping, Dry Needling, Joint mobilization, Joint manipulation, Spinal manipulation, Spinal mobilization, Cryotherapy, and Moist heat.  PLAN FOR NEXT SESSION: update HEP as appropriate, utilize exercises for directional preference as appropriate, progressive core/LE/functional strengthening/motor-control/muscle endurance as tolerated, education, manual therapy as needed.    Luretha Murphy. [name], PT, DPT 10/10/23, 8:01 PM  [facility] [address] [place], [place] [address] P: [phone] I F: [phone]

## 2023-11-22 ENCOUNTER — Encounter: Admitting: Physical Therapy

## 2023-11-29 ENCOUNTER — Encounter: Admitting: Physical Therapy

## 2023-12-05 ENCOUNTER — Encounter: Payer: Self-pay | Admitting: Internal Medicine

## 2023-12-05 ENCOUNTER — Ambulatory Visit: Admitting: General Practice

## 2023-12-05 ENCOUNTER — Encounter
Admission: RE | Disposition: A | Discharge status: Home / Self Care | Payer: Self-pay | Source: Home / Self Care | Attending: Internal Medicine

## 2023-12-05 ENCOUNTER — Ambulatory Visit
Admission: RE | Admit: 2023-12-05 | Discharge: 2023-12-05 | Disposition: A | Discharge status: Home / Self Care | Payer: Self-pay | Attending: Internal Medicine | Admitting: Internal Medicine

## 2023-12-05 DIAGNOSIS — Z7984 Long term (current) use of oral hypoglycemic drugs: Secondary | ICD-10-CM | POA: Insufficient documentation

## 2023-12-05 DIAGNOSIS — E119 Type 2 diabetes mellitus without complications: Secondary | ICD-10-CM | POA: Insufficient documentation

## 2023-12-05 DIAGNOSIS — Z1211 Encounter for screening for malignant neoplasm of colon: Secondary | ICD-10-CM | POA: Diagnosis present

## 2023-12-05 DIAGNOSIS — Z7985 Long-term (current) use of injectable non-insulin antidiabetic drugs: Secondary | ICD-10-CM | POA: Diagnosis not present

## 2023-12-05 HISTORY — PX: COLONOSCOPY: SHX5424

## 2023-12-05 LAB — POCT PREGNANCY, URINE: Preg Test, Ur: NEGATIVE

## 2023-12-05 SURGERY — COLONOSCOPY
Anesthesia: General

## 2023-12-05 MED ORDER — LIDOCAINE HCL (CARDIAC) PF 100 MG/5ML IV SOSY
PREFILLED_SYRINGE | INTRAVENOUS | Status: DC | PRN
  Administered 2023-12-05: 40 mg via INTRAVENOUS

## 2023-12-05 MED ORDER — SODIUM CHLORIDE 0.9 % IV SOLN
INTRAVENOUS | Status: DC
  Administered 2023-12-05: 1000 mL via INTRAVENOUS

## 2023-12-05 MED ORDER — PROPOFOL 10 MG/ML IV BOLUS
INTRAVENOUS | Status: DC | PRN
  Administered 2023-12-05: 80 mg via INTRAVENOUS

## 2023-12-05 MED ORDER — PROPOFOL 500 MG/50ML IV EMUL
INTRAVENOUS | Status: DC | PRN
  Administered 2023-12-05: 150 ug/kg/min via INTRAVENOUS

## 2023-12-05 MED ORDER — STERILE WATER FOR IRRIGATION IR SOLN
Status: DC | PRN
  Administered 2023-12-05: 60 mL

## 2023-12-05 NOTE — Anesthesia Preprocedure Evaluation (Signed)
 Anesthesia Evaluation  Patient identified by MRN, date of birth, ID band Patient awake    Reviewed: Allergy & Precautions, NPO status , Patient's Chart, lab work & pertinent test results  Airway Mallampati: III  TM Distance: >3 FB Neck ROM: full    Dental  (+) Chipped   Pulmonary neg pulmonary ROS   Pulmonary exam normal        Cardiovascular negative cardio ROS Normal cardiovascular exam     Neuro/Psych negative neurological ROS  negative psych ROS   GI/Hepatic negative GI ROS, Neg liver ROS,,,  Endo/Other  negative endocrine ROSdiabetes    Renal/GU negative Renal ROS  negative genitourinary   Musculoskeletal   Abdominal   Peds  Hematology negative hematology ROS (+)   Anesthesia Other Findings Past Medical History: No date: Diabetes mellitus without complication (HCC) No date: Hypercholesterolemia  Past Surgical History: 2017: DILATION AND CURETTAGE OF UTERUS     Reproductive/Obstetrics negative OB ROS                              Anesthesia Physical Anesthesia Plan  ASA: 2  Anesthesia Plan: General   Post-op Pain Management: Minimal or no pain anticipated   Induction: Intravenous  PONV Risk Score and Plan: 3 and Propofol infusion, TIVA and Ondansetron   Airway Management Planned: Nasal Cannula  Additional Equipment: None  Intra-op Plan:   Post-operative Plan:   Informed Consent: I have reviewed the patients History and Physical, chart, labs and discussed the procedure including the risks, benefits and alternatives for the proposed anesthesia with the patient or authorized representative who has indicated his/her understanding and acceptance.     Dental advisory given  Plan Discussed with: CRNA and Surgeon  Anesthesia Plan Comments: (Discussed risks of anesthesia with patient, including possibility of difficulty with spontaneous ventilation under anesthesia  necessitating airway intervention, PONV, and rare risks such as cardiac or respiratory or neurological events, and allergic reactions. Discussed the role of CRNA in patient's perioperative care. Patient understands.)         Anesthesia Quick Evaluation

## 2023-12-05 NOTE — Interval H&P Note (Signed)
 History and Physical Interval Note:  12/05/2023 9:34 AM  Cathy Ponce- Adelene Homer  has presented today for surgery, with the diagnosis of Colon cancer screening (Z12.11).  The various methods of treatment have been discussed with the patient and family. After consideration of risks, benefits and other options for treatment, the patient has consented to  Procedure(s) with comments: COLONOSCOPY (N/A) - DM as a surgical intervention.  The patient's history has been reviewed, patient examined, no change in status, stable for surgery.  I have reviewed the patient's chart and labs.  Questions were answered to the patient's satisfaction.     [place], Piercen Covino

## 2023-12-05 NOTE — Anesthesia Procedure Notes (Signed)
 Date/Time: 12/05/2023 10:00 AM  Performed by: Racheal Buddle, CRNAPre-anesthesia Checklist: Patient identified, Emergency Drugs available, Suction available and Patient being monitored Patient Re-evaluated:Patient Re-evaluated prior to induction Oxygen Delivery Method: Nasal cannula Induction Type: IV induction Dental Injury: Teeth and Oropharynx as per pre-operative assessment  Comments: Nasal cannula with etCO2 monitoring

## 2023-12-05 NOTE — Anesthesia Postprocedure Evaluation (Signed)
 Anesthesia Post Note  Patient: Cathy Ponce  Procedure(s) Performed: COLONOSCOPY  Patient location during evaluation: Endoscopy Anesthesia Type: General Level of consciousness: awake and alert Pain management: pain level controlled Vital Signs Assessment: post-procedure vital signs reviewed and stable Respiratory status: spontaneous breathing, nonlabored ventilation, respiratory function stable and patient connected to nasal cannula oxygen Cardiovascular status: blood pressure returned to baseline and stable Postop Assessment: no apparent nausea or vomiting Anesthetic complications: no  No notable events documented.   Last Vitals:  Vitals:   12/05/23 1024 12/05/23 1034  BP: 126/71 125/65  Pulse: 68 73  Resp: 11 (!) 21  Temp:    SpO2: 100% 100%    Last Pain:  Vitals:   12/05/23 1034  TempSrc:   PainSc: 0-No pain                 Enrique Harvest

## 2023-12-05 NOTE — Op Note (Signed)
 [facility] Patient Name: Cathy Ponce Procedure Date: 12/05/2023 9:36 AM MRN: [id number] Account #: [id number] Date of Birth: Aug 03, 1975 Admit Type: Outpatient Age: 48 Room: [facility] ENDO ROOM 3 Gender: Female Note Status: Finalized Instrument Name: Hyman Main [id number] Procedure:             Colonoscopy Indications:           Screening for colorectal malignant neoplasm Providers:             Nimra Puccinelli K. Corky Diener MD, MD Referring MD:          [facility] Medicines:             Propofol per Anesthesia Complications:         No immediate complications. Procedure:             Pre-Anesthesia Assessment:                        - The risks and benefits of the procedure and the                         sedation options and risks were discussed with the                         patient. All questions were answered and informed                         consent was obtained.                        - Patient identification and proposed procedure were                         verified prior to the procedure by the nurse. The                         procedure was verified in the procedure room.                        - ASA Grade Assessment: II - A patient with mild                         systemic disease.                        - After reviewing the risks and benefits, the patient                         was deemed in satisfactory condition to undergo the                         procedure.                        After obtaining informed consent, the colonoscope was                         passed under direct vision. Throughout the procedure,                         the patient's blood  pressure, pulse, and oxygen                         saturations were monitored continuously. The                         Colonoscope was introduced through the anus and                         advanced to the the cecum, identified by appendiceal                         orifice  and ileocecal valve. The colonoscopy was                         performed without difficulty. The patient tolerated                         the procedure well. The quality of the bowel                         preparation was good. The ileocecal valve, appendiceal                         orifice, and rectum were photographed. Findings:      The perianal and digital rectal examinations were normal. Pertinent       negatives include normal sphincter tone and no palpable rectal lesions.      The entire examined colon appeared normal on direct and retroflexion       views. Impression:            - The entire examined colon is normal on direct and                         retroflexion views.                        - No specimens collected. Recommendation:        - Patient has a contact number available for                         emergencies. The signs and symptoms of potential                         delayed complications were discussed with the patient.                         Return to normal activities tomorrow. Written                         discharge instructions were provided to the patient.                        - Resume previous diet.                        - Continue present medications.                        - Repeat colonoscopy in 10 years  for screening                         purposes.                        - Return to GI office PRN.                        - The findings and recommendations were discussed with                         the patient. Procedure Code(s):     --- Professional ---                        Z6109, Colorectal cancer screening; colonoscopy on                         individual not meeting criteria for high risk Diagnosis Code(s):     --- Professional ---                        Z12.11, Encounter for screening for malignant neoplasm                         of colon CPT copyright 2022 [employer] Association. All rights reserved. The codes documented  in this report are preliminary and upon coder review may  be revised to meet current compliance requirements. Cassie Click MD, MD 12/05/2023 10:13:40 AM This report has been signed electronically. Number of Addenda: 0 Note Initiated On: 12/05/2023 9:36 AM Scope Withdrawal Time: 0 hours 6 minutes 4 seconds  Total Procedure Duration: 0 hours 11 minutes 13 seconds  Estimated Blood Loss:  Estimated blood loss: none.      [facility]

## 2023-12-05 NOTE — Transfer of Care (Signed)
 Immediate Anesthesia Transfer of Care Note  Patient: Cathy Ponce  Procedure(s) Performed: Procedure(s) with comments: COLONOSCOPY (N/A) - DM  Patient Location: PACU and Endoscopy Unit  Anesthesia Type:General  Level of Consciousness: sedated  Airway & Oxygen Therapy: Patient Spontanous Breathing and Patient connected to nasal cannula oxygen  Post-op Assessment: Report given to RN and Post -op Vital signs reviewed and stable  Post vital signs: Reviewed and stable  Last Vitals:  Vitals:   12/05/23 0917 12/05/23 1014  BP: (!) 118/49 110/62  Pulse: (!) 56 (!) 55  Resp: 16 15  Temp: (!) 36.2 C (!) 36.1 C  SpO2: 100% 99%    Complications: No apparent anesthesia complications

## 2023-12-05 NOTE — H&P (Signed)
 Outpatient short stay form Pre-procedure 12/05/2023 9:33 AM Cathy Ponce K. Corky Diener, M.D.  Primary Physician: Rosezena Contes, PA-C  Reason for visit:  Colon cancer screening  History of present illness:  Patient presents for colonoscopy for colon cancer screening. The patient denies complaints of abdominal pain, significant change in bowel habits, or rectal bleeding.      Current Facility-Administered Medications:    0.9 %  sodium chloride infusion, , Intravenous, Continuous, Malissia Rabbani K, MD  Medications Prior to Admission  Medication Sig Dispense Refill Last Dose/Taking   meloxicam  (MOBIC ) 15 MG tablet Take 1 tablet (15 mg total) by mouth daily as needed. 30 tablet 1 Past Month   methocarbamol  (ROBAXIN ) 500 MG tablet Take 1 tablet (500 mg total) by mouth at bedtime as needed. 30 tablet 1 Past Month   Na Sulfate-K Sulfate-Mg Sulfate concentrate (SUPREP) 17.5-3.13-1.6 GM/177ML SOLN Take 2 Bottles (1 kit total) by mouth as directed. One kit contains 2 bottles.  Take both bottles at the times instructed by your provider. 354 mL 0 12/04/2023   acetaminophen (TYLENOL) 500 MG tablet Take 500 mg by mouth 2 (two) times daily. (Patient not taking: Reported on 08/31/2021)      atorvastatin  (LIPITOR) 20 MG tablet Take by mouth.      atorvastatin  (LIPITOR) 20 MG tablet Take 1 tablet (20 mg total) by mouth daily. 90 tablet 3 11/27/2023   azithromycin  (ZITHROMAX ) 250 MG tablet 2 tab po daily for 3 days (Patient not taking: Reported on 08/31/2021) 6 tablet 0    cyclobenzaprine  (FLEXERIL ) 10 MG tablet Take 1 tablet (10 mg total) by mouth 3 (three) times daily as needed. 30 tablet 0 11/27/2023   Dulaglutide  (TRULICITY ) 0.75 MG/0.5ML SOPN Inject 0.75 mg into the skin once a week.   11/20/2023   Dulaglutide  (TRULICITY ) 0.75 MG/0.5ML SOPN Inject 0.75 mg into the skin once a week. 2 mL 0    Dulaglutide  (TRULICITY ) 1.5 MG/0.5ML SOAJ Inject 1.5 mg into the skin every 7 (seven) days. 2 mL 1    Dulaglutide  (TRULICITY )  1.5 MG/0.5ML SOAJ Inject 1.5 mg into the skin once a week. 2 mL 11    Dulaglutide  (TRULICITY ) 1.5 MG/0.5ML SOAJ Inject 1.5 mg into the skin once a week. 6 mL 3    glipiZIDE (GLUCOTROL XL) 5 MG 24 hr tablet Take by mouth.      meloxicam  (MOBIC ) 15 MG tablet Take 1 tablet (15 mg total) by mouth daily as needed. Take with a meal 30 tablet 1    metFORMIN (GLUCOPHAGE-XR) 500 MG 24 hr tablet Take by mouth. (Patient not taking: Reported on 08/31/2022)      methocarbamol  (ROBAXIN ) 500 MG tablet Take 1 tablet (500 mg total) by mouth at bedtime as needed. 30 tablet 1    ondansetron  (ZOFRAN -ODT) 4 MG disintegrating tablet Take 1 tablet (4 mg total) by mouth every 8 (eight) hours as needed for Nausea for up to 7 days 20 tablet 0    predniSONE  (STERAPRED UNI-PAK 21 TAB) 5 MG (21) TBPK tablet Take as directed on package (Patient not taking: Reported on 12/05/2023) 21 tablet 1 Completed Course     Allergies  Allergen Reactions   Penicillins Itching and Rash     Past Medical History:  Diagnosis Date   Diabetes mellitus without complication (HCC)    Hypercholesterolemia     Review of systems:  Otherwise negative.    Physical Exam  Gen: Alert, oriented. Appears stated age.  HEENT: /AT. PERRLA. Lungs: CTA, no wheezes. CV:  RR nl S1, S2. Abd: soft, benign, no masses. BS+ Ext: No edema. Pulses 2+    Planned procedures: Proceed with colonoscopy. The patient understands the nature of the planned procedure, indications, risks, alternatives and potential complications including but not limited to bleeding, infection, perforation, damage to internal organs and possible oversedation/side effects from anesthesia. The patient agrees and gives consent to proceed.  Please refer to procedure notes for findings, recommendations and patient disposition/instructions.     Kaelen Brennan K. Corky Diener, M.D. Gastroenterology 12/05/2023  9:33 AM

## 2023-12-06 ENCOUNTER — Encounter: Payer: Self-pay | Admitting: Internal Medicine

## 2023-12-13 ENCOUNTER — Other Ambulatory Visit (HOSPITAL_COMMUNITY): Payer: Self-pay

## 2023-12-18 ENCOUNTER — Other Ambulatory Visit (HOSPITAL_COMMUNITY): Payer: Self-pay

## 2023-12-18 ENCOUNTER — Encounter (HOSPITAL_COMMUNITY): Payer: Self-pay

## 2023-12-20 ENCOUNTER — Other Ambulatory Visit: Payer: Self-pay

## 2023-12-22 ENCOUNTER — Other Ambulatory Visit (HOSPITAL_COMMUNITY): Payer: Self-pay

## 2023-12-23 ENCOUNTER — Other Ambulatory Visit (HOSPITAL_COMMUNITY): Payer: Self-pay

## 2023-12-24 ENCOUNTER — Other Ambulatory Visit (HOSPITAL_COMMUNITY): Payer: Self-pay

## 2023-12-25 ENCOUNTER — Other Ambulatory Visit (HOSPITAL_BASED_OUTPATIENT_CLINIC_OR_DEPARTMENT_OTHER): Payer: Self-pay

## 2023-12-25 ENCOUNTER — Other Ambulatory Visit (HOSPITAL_COMMUNITY): Payer: Self-pay

## 2023-12-28 ENCOUNTER — Other Ambulatory Visit (HOSPITAL_COMMUNITY): Payer: Self-pay

## 2024-01-01 ENCOUNTER — Other Ambulatory Visit (HOSPITAL_COMMUNITY): Payer: Self-pay

## 2024-01-04 ENCOUNTER — Other Ambulatory Visit (HOSPITAL_COMMUNITY): Payer: Self-pay

## 2024-01-06 ENCOUNTER — Other Ambulatory Visit (HOSPITAL_COMMUNITY): Payer: Self-pay

## 2024-08-05 ENCOUNTER — Other Ambulatory Visit (HOSPITAL_COMMUNITY): Payer: Self-pay

## 2024-08-05 ENCOUNTER — Other Ambulatory Visit: Payer: Self-pay

## 2024-08-06 ENCOUNTER — Encounter: Payer: Self-pay | Admitting: Pharmacist

## 2024-08-06 ENCOUNTER — Other Ambulatory Visit: Payer: Self-pay

## 2024-08-09 ENCOUNTER — Other Ambulatory Visit: Payer: Self-pay
